# Patient Record
Sex: Male | Born: 1965 | Race: Black or African American | Hispanic: No | Marital: Married | Smoking: Former smoker
Health system: Southern US, Academic
[De-identification: ages and names within clinical notes are randomized; demographics above are authoritative.]

## PROBLEM LIST (undated history)

## (undated) DIAGNOSIS — M129 Arthropathy, unspecified: Secondary | ICD-10-CM

## (undated) DIAGNOSIS — I10 Essential (primary) hypertension: Secondary | ICD-10-CM

## (undated) DIAGNOSIS — Z973 Presence of spectacles and contact lenses: Secondary | ICD-10-CM

## (undated) DIAGNOSIS — E7800 Pure hypercholesterolemia, unspecified: Secondary | ICD-10-CM

## (undated) HISTORY — PX: HX FOOT SURGERY: 2100001154

## (undated) HISTORY — PX: HX TONSILLECTOMY: SHX27

## (undated) HISTORY — PX: FRACTURE SURGERY: SHX138

## (undated) HISTORY — PX: TONSILLECTOMY: SUR1361

---

## 2015-06-27 ENCOUNTER — Encounter (HOSPITAL_COMMUNITY)
Admission: EM | Disposition: A | Discharge status: Skilled Nursing Facility | Payer: Self-pay | Source: Home / Self Care | Attending: Surgery

## 2015-06-27 ENCOUNTER — Inpatient Hospital Stay
Admission: EM | Admit: 2015-06-27 | Discharge: 2015-07-01 | DRG: 482 | Disposition: A | Discharge status: Rehabilitation Facility | Payer: No Typology Code available for payment source | Attending: Surgery | Admitting: Surgery

## 2015-06-27 ENCOUNTER — Inpatient Hospital Stay (HOSPITAL_COMMUNITY): Payer: No Typology Code available for payment source

## 2015-06-27 ENCOUNTER — Emergency Department (EMERGENCY_DEPARTMENT_HOSPITAL): Payer: No Typology Code available for payment source

## 2015-06-27 ENCOUNTER — Other Ambulatory Visit (HOSPITAL_COMMUNITY): Payer: Self-pay

## 2015-06-27 ENCOUNTER — Encounter (HOSPITAL_COMMUNITY): Payer: Self-pay | Admitting: Student in an Organized Health Care Education/Training Program

## 2015-06-27 ENCOUNTER — Inpatient Hospital Stay (HOSPITAL_COMMUNITY): Payer: No Typology Code available for payment source | Admitting: Surgery

## 2015-06-27 ENCOUNTER — Inpatient Hospital Stay (HOSPITAL_COMMUNITY)
Admission: RE | Admit: 2015-06-27 | Discharge: 2015-06-27 | Disposition: A | Discharge status: Home / Self Care | Payer: No Typology Code available for payment source | Source: Ambulatory Visit

## 2015-06-27 ENCOUNTER — Inpatient Hospital Stay (HOSPITAL_COMMUNITY): Payer: No Typology Code available for payment source | Admitting: Anesthesiology

## 2015-06-27 ENCOUNTER — Emergency Department (HOSPITAL_COMMUNITY): Payer: No Typology Code available for payment source

## 2015-06-27 DIAGNOSIS — S72302B Unspecified fracture of shaft of left femur, initial encounter for open fracture type I or II: Secondary | ICD-10-CM

## 2015-06-27 DIAGNOSIS — Z23 Encounter for immunization: Secondary | ICD-10-CM

## 2015-06-27 DIAGNOSIS — W19XXXA Unspecified fall, initial encounter: Secondary | ICD-10-CM

## 2015-06-27 DIAGNOSIS — M25462 Effusion, left knee: Secondary | ICD-10-CM

## 2015-06-27 DIAGNOSIS — S72301A Unspecified fracture of shaft of right femur, initial encounter for closed fracture: Secondary | ICD-10-CM

## 2015-06-27 DIAGNOSIS — T1490XA Injury, unspecified, initial encounter: Secondary | ICD-10-CM

## 2015-06-27 DIAGNOSIS — S8991XA Unspecified injury of right lower leg, initial encounter: Secondary | ICD-10-CM

## 2015-06-27 DIAGNOSIS — S7292XA Unspecified fracture of left femur, initial encounter for closed fracture: Secondary | ICD-10-CM

## 2015-06-27 DIAGNOSIS — S3993XA Unspecified injury of pelvis, initial encounter: Secondary | ICD-10-CM

## 2015-06-27 DIAGNOSIS — X58XXXA Exposure to other specified factors, initial encounter: Secondary | ICD-10-CM | POA: Diagnosis present

## 2015-06-27 DIAGNOSIS — S7291XA Unspecified fracture of right femur, initial encounter for closed fracture: Secondary | ICD-10-CM

## 2015-06-27 DIAGNOSIS — S298XXA Other specified injuries of thorax, initial encounter: Secondary | ICD-10-CM

## 2015-06-27 DIAGNOSIS — W3189XA Contact with other specified machinery, initial encounter: Secondary | ICD-10-CM

## 2015-06-27 DIAGNOSIS — I499 Cardiac arrhythmia, unspecified: Secondary | ICD-10-CM

## 2015-06-27 DIAGNOSIS — S72302A Unspecified fracture of shaft of left femur, initial encounter for closed fracture: Secondary | ICD-10-CM

## 2015-06-27 DIAGNOSIS — S8992XA Unspecified injury of left lower leg, initial encounter: Secondary | ICD-10-CM

## 2015-06-27 DIAGNOSIS — F17210 Nicotine dependence, cigarettes, uncomplicated: Secondary | ICD-10-CM | POA: Diagnosis present

## 2015-06-27 DIAGNOSIS — M25461 Effusion, right knee: Secondary | ICD-10-CM

## 2015-06-27 DIAGNOSIS — S3991XA Unspecified injury of abdomen, initial encounter: Secondary | ICD-10-CM

## 2015-06-27 DIAGNOSIS — T1490 Injury, unspecified: Secondary | ICD-10-CM

## 2015-06-27 DIAGNOSIS — S7290XA Unspecified fracture of unspecified femur, initial encounter for closed fracture: Secondary | ICD-10-CM

## 2015-06-27 HISTORY — DX: Exposure to other specified factors, initial encounter: X58.XXXA

## 2015-06-27 HISTORY — DX: Unspecified fracture of left femur, initial encounter for closed fracture (CMS HCC): S72.92XA

## 2015-06-27 HISTORY — DX: Unspecified fracture of right femur, initial encounter for closed fracture (CMS HCC): S72.91XA

## 2015-06-27 HISTORY — DX: Presence of spectacles and contact lenses: Z97.3

## 2015-06-27 HISTORY — DX: Pure hypercholesterolemia, unspecified: E78.00

## 2015-06-27 LAB — CBC WITH DIFF
BASOPHIL #: 0.09 x10ˆ3/uL (ref 0.00–0.20)
BASOPHIL %: 1 %
EOSINOPHIL #: 0.1 x10ˆ3/uL (ref 0.00–0.50)
EOSINOPHIL %: 1 %
HCT: 42.2 % (ref 36.7–47.0)
HGB: 14 g/dL (ref 12.5–16.3)
LYMPHOCYTE #: 1.41 x10ˆ3/uL (ref 1.00–4.80)
LYMPHOCYTE %: 9 %
MCH: 31.5 pg (ref 27.4–33.0)
MCHC: 33.2 g/dL (ref 32.5–35.8)
MCV: 94.9 fL (ref 78.0–100.0)
MCV: 94.9 fL (ref 78.0–100.0)
MONOCYTE #: 1.06 x10ˆ3/uL — ABNORMAL HIGH (ref 0.30–1.00)
MONOCYTE %: 7 %
MPV: 9.1 fL (ref 7.5–11.5)
NEUTROPHIL #: 12.21 x10ˆ3/uL — ABNORMAL HIGH (ref 1.50–7.70)
NEUTROPHIL %: 82 %
PLATELETS: 257 x10ˆ3/uL (ref 140–450)
RBC: 4.45 x10ˆ6/uL (ref 4.06–5.63)
RDW: 13.4 % (ref 12.0–15.0)
WBC: 14.9 x10ˆ3/uL — ABNORMAL HIGH (ref 3.5–11.0)

## 2015-06-27 LAB — PT/INR
INR: 1.02 (ref 0.80–1.20)
PROTHROMBIN TIME: 11.4 s (ref 9.0–13.4)

## 2015-06-27 LAB — VENOUS BLOOD GAS/LACTATE/CO-OX/LYTES (NA/K/CA/CL/GLUC) - ORS ONLY
%FIO2 (VENOUS): 21 %
BASE EXCESS: 1.9 mmol/L (ref ?–3.0)
BICARBONATE (VENOUS): 24.6 mmol/L (ref 22.0–26.0)
CARBOXYHEMOGLOBIN: 2.3 % (ref 0.0–2.5)
CHLORIDE: 107 mmol/L (ref 96–111)
GLUCOSE: 112 mg/dL — ABNORMAL HIGH (ref 60–105)
HEMOGLOBIN: 14.4 g/dL (ref 12.0–18.0)
IONIZED CALCIUM: 1.29 mmol/L (ref 1.10–1.36)
LACTATE: 0.9 mmol/L (ref 0.0–1.3)
MET-HEMOGLOBIN: 1.9 % (ref 0.0–3.5)
O2CT: 7 % (ref 6.7–17.5)
OXYHEMOGLOBIN: 34.8 % — CL (ref 40.0–80.0)
PCO2 (VENOUS): 57 mmHg — ABNORMAL HIGH (ref 41.00–51.00)
PH (VENOUS): 7.32 (ref 7.31–7.41)
PO2 (VENOUS): 24 mmHg — ABNORMAL LOW (ref 35.0–50.0)
SODIUM: 138 mmol/L (ref 136–145)
WHOLE BLOOD POTASSIUM: 4.4 mmol/L (ref 3.5–5.0)

## 2015-06-27 LAB — BUN: BUN: 18 mg/dL (ref 8–25)

## 2015-06-27 LAB — PTT (PARTIAL THROMBOPLASTIN TIME): APTT: 24.6 s — ABNORMAL LOW (ref 25.1–36.5)

## 2015-06-27 LAB — CREATININE WITH EGFR
CREATININE: 1.36 mg/dL — ABNORMAL HIGH (ref 0.62–1.27)
ESTIMATED GFR: 59 mL/min/1.73mˆ2 — ABNORMAL LOW (ref >59)

## 2015-06-27 LAB — ETHANOL, SERUM/PLASMA: ETHANOL: NOT DETECTED

## 2015-06-27 SURGERY — INSERTION NAIL TROCHANTERIC FEMUR
Anesthesia: General | Laterality: Bilateral | Wound class: Clean Wound: Uninfected operative wounds in which no inflammation occurred

## 2015-06-27 MED ORDER — FAMOTIDINE 20 MG TABLET
20.0000 mg | ORAL_TABLET | Freq: Two times a day (BID) | ORAL | Status: DC
Start: 2015-06-28 — End: 2015-06-28
  Filled 2015-06-27 (×2): qty 1

## 2015-06-27 MED ORDER — GENTAMICIN 40 MG/ML INJECTION SOLUTION
5.0000 mg/kg | Freq: Once | INTRAMUSCULAR | Status: DC
Start: 2015-06-28 — End: 2015-06-27

## 2015-06-27 MED ORDER — IOPAMIDOL 300 MG IODINE/ML (61 %) INTRAVENOUS SOLUTION
100.00 mL | INTRAVENOUS | Status: AC
Start: 2015-06-27 — End: 2015-06-27

## 2015-06-27 MED ORDER — ONDANSETRON HCL (PF) 4 MG/2 ML INJECTION SOLUTION
4.0000 mg | Freq: Three times a day (TID) | INTRAMUSCULAR | Status: DC | PRN
Start: 2015-06-27 — End: 2015-06-30

## 2015-06-27 MED ORDER — PROPOFOL 10 MG/ML IV BOLUS
INJECTION | Freq: Once | INTRAVENOUS | Status: DC | PRN
Start: 2015-06-27 — End: 2015-06-28
  Administered 2015-06-27: 200 mg via INTRAVENOUS

## 2015-06-27 MED ORDER — LIDOCAINE (PF) 100 MG/5 ML (2 %) INTRAVENOUS SYRINGE
INJECTION | Freq: Once | INTRAVENOUS | Status: DC | PRN
Start: 2015-06-27 — End: 2015-06-28
  Administered 2015-06-27: 100 mg via INTRAVENOUS

## 2015-06-27 MED ORDER — DOCUSATE SODIUM 100 MG CAPSULE
100.0000 mg | ORAL_CAPSULE | Freq: Two times a day (BID) | ORAL | Status: DC
Start: 2015-06-28 — End: 2015-07-01
  Administered 2015-06-28 – 2015-07-01 (×7): 100 mg via ORAL
  Filled 2015-06-27 (×8): qty 1

## 2015-06-27 MED ORDER — MIDAZOLAM 1 MG/ML INJECTION SOLUTION
Freq: Once | INTRAMUSCULAR | Status: DC | PRN
Start: 2015-06-27 — End: 2015-06-28
  Administered 2015-06-27: 2 mg via INTRAVENOUS

## 2015-06-27 MED ORDER — ROCURONIUM 10 MG/ML INTRAVENOUS SOLUTION
Freq: Once | INTRAVENOUS | Status: DC | PRN
Start: 2015-06-27 — End: 2015-06-28
  Administered 2015-06-27: 100 mg via INTRAVENOUS

## 2015-06-27 MED ORDER — FENTANYL (PF) 50 MCG/ML INJECTION SOLUTION
200.0000 ug | INTRAMUSCULAR | Status: AC
Start: 2015-06-27 — End: 2015-06-27

## 2015-06-27 MED ORDER — FENTANYL (PF) 50 MCG/ML INJECTION SOLUTION
INTRAMUSCULAR | Status: AC
Start: 2015-06-27 — End: 2015-06-27
  Administered 2015-06-27: 100 ug via INTRAVENOUS
  Filled 2015-06-27: qty 4

## 2015-06-27 MED ORDER — REMIFENTANIL 50 MCG/ML INFUSION - FOR ANES
INTRAVENOUS | Status: DC | PRN
Start: 2015-06-27 — End: 2015-06-28
  Administered 2015-06-27: 0.1 ug/kg/min via INTRAVENOUS
  Administered 2015-06-28: 0 ug/kg/min via INTRAVENOUS

## 2015-06-27 MED ORDER — SODIUM CHLORIDE 0.9 % INTRAVENOUS SOLUTION
5.0000 mg/kg | Freq: Once | INTRAVENOUS | Status: AC
Start: 2015-06-28 — End: 2015-06-27
  Administered 2015-06-27: 500 mg via INTRAVENOUS
  Filled 2015-06-27: qty 12.5

## 2015-06-27 MED ORDER — LACTATED RINGERS INTRAVENOUS SOLUTION
INTRAVENOUS | Status: DC
Start: 2015-06-28 — End: 2015-06-29

## 2015-06-27 MED ORDER — CEFAZOLIN 1 GRAM SOLUTION FOR INJECTION
Freq: Once | INTRAMUSCULAR | Status: DC | PRN
Start: 2015-06-27 — End: 2015-06-28
  Administered 2015-06-27: 1000 mg via INTRAVENOUS

## 2015-06-27 MED ORDER — FENTANYL (PF) 50 MCG/ML INJECTION SOLUTION
INTRAMUSCULAR | Status: AC
Start: 2015-06-27 — End: 2015-06-27
  Administered 2015-06-27: 50 ug via INTRAVENOUS
  Filled 2015-06-27: qty 2

## 2015-06-27 MED ORDER — SODIUM CHLORIDE 0.9 % INTRAVENOUS SOLUTION
INTRAVENOUS | Status: DC
Start: 2015-06-27 — End: 2015-06-27

## 2015-06-27 MED ORDER — ENOXAPARIN 30 MG/0.3 ML SUBCUTANEOUS SYRINGE
30.0000 mg | INJECTION | Freq: Two times a day (BID) | SUBCUTANEOUS | Status: DC
Start: 2015-06-28 — End: 2015-06-29
  Administered 2015-06-28 – 2015-06-29 (×3): 30 mg via SUBCUTANEOUS
  Filled 2015-06-27: qty 0.3
  Filled 2015-06-27: qty 0
  Filled 2015-06-27 (×2): qty 0.3

## 2015-06-27 MED ORDER — MAGNESIUM HYDROXIDE 400 MG/5 ML ORAL SUSPENSION
15.0000 mL | ORAL | Status: DC
Start: 2015-06-28 — End: 2015-06-29
  Administered 2015-06-28: 1200 mg via ORAL

## 2015-06-27 MED ORDER — CEFAZOLIN 10 GRAM SOLUTION FOR INJECTION
2.0000 g | Freq: Once | INTRAMUSCULAR | Status: AC
Start: 2015-06-27 — End: 2015-06-27
  Administered 2015-06-27: 2 g via INTRAVENOUS

## 2015-06-27 MED ORDER — SODIUM CHLORIDE 0.9 % INTRAVENOUS SOLUTION
INTRAVENOUS | Status: DC | PRN
Start: 2015-06-27 — End: 2015-06-28

## 2015-06-27 MED ORDER — PHENYLEPHRINE 0.5 MG/5 ML (100 MCG/ML)IN 0.9 % SOD.CHLORIDE IV SYRINGE
INJECTION | Freq: Once | INTRAVENOUS | Status: DC | PRN
Start: 2015-06-27 — End: 2015-06-28
  Administered 2015-06-27 – 2015-06-28 (×9): 100 ug via INTRAVENOUS

## 2015-06-27 MED ORDER — SENNOSIDES 8.6 MG-DOCUSATE SODIUM 50 MG TABLET
1.0000 | ORAL_TABLET | Freq: Two times a day (BID) | ORAL | Status: DC
Start: 2015-06-28 — End: 2015-07-01
  Administered 2015-06-28 – 2015-07-01 (×7): 1 via ORAL
  Filled 2015-06-27 (×8): qty 1

## 2015-06-27 MED ORDER — GENTAMICIN IV - PHARMACIST TO DOSE PER PROTOCOL
Freq: Every day | Status: DC | PRN
Start: 2015-06-27 — End: 2015-06-27

## 2015-06-27 MED ORDER — HYDROMORPHONE 1 MG/ML IN 0.9 % SODIUM CHLORIDE INJECTION
100.0000 mL | INTRAMUSCULAR | Status: DC | PRN
Start: 2015-06-27 — End: 2015-06-29
  Administered 2015-06-28: 100 mL via INTRAVENOUS
  Filled 2015-06-27: qty 100

## 2015-06-27 MED ORDER — FENTANYL (PF) 50 MCG/ML INJECTION SOLUTION
Freq: Once | INTRAMUSCULAR | Status: DC | PRN
Start: 2015-06-27 — End: 2015-06-28
  Administered 2015-06-27: 100 ug via INTRAVENOUS

## 2015-06-27 MED ORDER — FENTANYL (PF) 50 MCG/ML INJECTION SOLUTION
50.00 ug | INTRAMUSCULAR | Status: AC
Start: 2015-06-27 — End: 2015-06-27

## 2015-06-27 MED ORDER — METHOCARBAMOL 500 MG TABLET
1000.00 mg | ORAL_TABLET | Freq: Four times a day (QID) | ORAL | Status: DC
Start: 2015-06-28 — End: 2015-07-01
  Administered 2015-06-28 – 2015-07-01 (×13): 1000 mg via ORAL
  Filled 2015-06-27 (×16): qty 2

## 2015-06-27 MED ORDER — LACTATED RINGERS INTRAVENOUS SOLUTION
INTRAVENOUS | Status: DC | PRN
Start: 2015-06-27 — End: 2015-06-28

## 2015-06-27 MED ORDER — ELECTROLYTE-A INTRAVENOUS SOLUTION
INTRAVENOUS | Status: DC
Start: 2015-06-28 — End: 2015-06-28

## 2015-06-27 MED ADMIN — lactated Ringers intravenous solution: INTRAVENOUS | @ 22:00:00

## 2015-06-27 SURGICAL SUPPLY — 49 items
APPL 70% ISPRP 2% CHG 26ML 13._2X13.2IN CHLRPRP PREP DEHP-FR (WOUND CARE/ENTEROSTOMAL SUPPLY)
APPL 70% ISPRP 2% CHG 26ML CHLRPRP HI-LT ORNG PREP STRL LF  DISP CLR (WOUND CARE SUPPLY) IMPLANT
BANDAGE COFLX NL 5YDX6IN_EASYTEAR ADH CHSV PAT TCH STRN (WOUND CARE/ENTEROSTOMAL SUPPLY) ×1
BLANKET 3M BAIR HUG ADLT UPR B ODY 74X24IN PLMR 2 INCS ADH (MISCELLANEOUS PT CARE ITEMS) ×2 IMPLANT
CONV USE ITEM 313924 - BANDAGE COFLX NL 5YDX6IN_EASYTEAR ADH CHSV PAT TCH STRN (WOUND CARE SUPPLY) ×1 IMPLANT
CONV USE ITEM 338659 - PACK SURG CUSTOM GN ORTHO NONST DISP LF (CUSTOM TRAYS & PACK) ×2 IMPLANT
DONUT EXTREMITY CUSHIONING 31143137 (POSITIONING PRODUCTS) ×2 IMPLANT
DRAPE 2 INCS FILM PCH ISOL 129X100IN LRG STRDRP IOBN STRL SURG PLASTIC 27X12IN CLR (PROTECTIVE PRODUCTS/GARMENTS) ×1 IMPLANT
DRAPE 2 INCS FILM PCH ISOL 129_X100IN LRG STRDRP IOBN STRL (PROTECTIVE PRODUCTS/GARMENTS) ×1
DRAPE BACKTABLE 72IN 10/CS 37501 (Other Miscellaneous) ×2 IMPLANT
DRAPE CARM FLRSCP EXPD CLPSBL C-ARMOR STRL EQP (DRAPE/PACKS/SHEETS/OR TOWEL) ×2 IMPLANT
DRAPE CARM FLRSCP EXPD CLPSBL_C-ARMOR STRL EQP (DRAPE/PACKS/SHEETS/OR TOWEL) ×2
DRESSING ABDOM 8 X 10IN 7198D 216/CS (WOUND CARE SUPPLY) ×1 IMPLANT
DRESSING ABDOM 8 X 10IN 7198D 216/CS (WOUND CARE/ENTEROSTOMAL SUPPLY) ×1
DRESSING OIL EMUL NONADH 3X8IN 6113 STRL CURITY LF 24/BX (WOUND CARE SUPPLY) IMPLANT
DRESSING OIL EMUL NONADH 3X8IN 6113 STRL CURITY LF 24/BX (WOUND CARE/ENTEROSTOMAL SUPPLY)
DRESSING XEROFORM 1 X 8IN 8884431302 (WOUND CARE SUPPLY) ×1 IMPLANT
DRESSING XEROFORM 1 X 8IN 8884431302 (WOUND CARE/ENTEROSTOMAL SUPPLY) ×1
GOWN SURG XL AAMI L3 NONREINFO_RCE HKLP CLSR STRL LTX PNK SMS (DGOW)
GOWN SURG XL L3 NONREINFORCE HKLP CLSR STRL LTX PNK SMS 47IN (DGOW) IMPLANT
HANDLE RIGID PLASTIC STRL LF  DISP DVN EZ HNDL SURG LIGHT (INSTRUMENTS) ×1
HANDLE RIGID PLASTIC STRL LF_DISP DVN EZ HNDL SURG LIGHT (INSTRUMENTS) ×1
KIT PLS LAV PLSVC + FAN SPRAY STRL LF  DISP (IRR) ×1 IMPLANT
KIT PLS LAV PLSVC + FAN SPRAY_STRL LF DISP (IRR) ×2
KIT RM TURNOVER CLEANOP CSTM INFCT CONTROL (KITS & TRAYS (DISPOSABLE)) ×1
KIT RM TURNOVER CLEANOP CSTM I_NFCT CONTROL (KITS & TRAYS (DISPOSABLE)) ×1
KIT RM TURNOVER CLEANOP CUSTOM INFCT CONTROL (KITS & TRAYS (DISPOSABLE)) ×1 IMPLANT
LABEL E-Z STICK_STLEZP1 100EA/CS (LABELS/CHART SUPPLIES) ×1
LABEL MED EZ PEEL MRKR LF (LABELS/CHART SUPPLIES) ×1 IMPLANT
MBO USE ITEM 317672 - HANDLE RIGID PLASTIC STRL LF  DISP DVN EZ HNDL SURG LIGHT (SURGICAL INSTRUMENTS) ×1 IMPLANT
NAIL 10MM TI CANN ANTGRD RETRO FEM 400MM STRL EXPERT INTRAMED RND BRL GRN (Nail) ×2 IMPLANT
PACK GENERAL ORTHO CUSTOM (CUSTOM TRAYS & PACK) ×2
PACK SURG CSTM GN ORTHO NONST DISP LF (CUSTOM TRAYS & PACK) ×2
PAD ARMBOARD FOAM BLU_FP-ECARM (POSITIONING PRODUCTS) ×2
PAD ARMBRD BLU (POSITIONING PRODUCTS) ×2 IMPLANT
ROD REAMING 950MM 2.5MM BALL TIP STRL (ORTHOPEDICS (NOT IMPLANTS)) ×2 IMPLANT
ROD REAMING 950MM 2.5MM BALL T_IP STRL (ORTHOPEDICS (NOT IMPLANTS)) ×2
SCREW BONE 5MM 4.3MM 40MM SLF TAP LOCK STRDR BLUNT TIP TI NIOBIUM AL T25 F/T NONST LIGHT GRN (Screw) ×2 IMPLANT
SCREW BONE 5MM 70MM SLF TAP LOCK BLUNT TIP 2 LEAD TI TIB T25 F/T NONST LIGHT GRN EXPERT (Screw) ×2 IMPLANT
SCREW BONE 5MM 85MM SLF TAP LOCK BLUNT TIP 2 LEAD TI TIB T25 F/T NONST LIGHT GRN EXPERT (Screw) ×2 IMPLANT
SPONGE GAUZE STRL 4 X 4IN TUB_6939 1280/CS (WOUND CARE SUPPLY) ×2 IMPLANT
SPONGE GAUZE STRL 4 X 4IN TUB_6939 1280/CS (WOUND CARE/ENTEROSTOMAL SUPPLY) ×2
STAPLER SKIN 4.1X6.5MM 35 W STPL CART LF  APS U DISP CLR SS PLASTIC (ENDOSCOPIC SUPPLIES) ×1 IMPLANT
STAPLER SKIN WIDE 35W_8886803712 12/BX (INSTRUMENTS ENDOMECHANICAL) ×1
SUTURE 1 CTX VICRYL 27IN VIOL BRD COAT ABS (SUTURE/WOUND CLOSURE) ×2 IMPLANT
SUTURE 1 CTX VICRYL 27IN VIOL_BRD COAT ABS (SUTURE/WOUND CLOSURE) ×2
SUTURE 2-0 CT1 VICRYL 27IN UNDYED BRD COAT ABS (SUTURE/WOUND CLOSURE) ×2 IMPLANT
SUTURE 2-0 CT1 VICRYL 27IN UND_YED BRD COAT ABS (SUTURE/WOUND CLOSURE) ×2
TAPE MICROFOAM 4IN 15284 BX/3 (MED/SURG TAPES) ×2 IMPLANT

## 2015-06-27 NOTE — H&P (Addendum)
[facility]  Trauma History & Physical      [name],[name], 49 y.o. male  Date of Birth:  08-Nov-1966  Date of Admission:  06/27/2015    Attending Physician: Dorothy Spark, MD    HPI:   Trauma Level Alert: P2  Time of Trauma Page: 2103   Arrival Time: 2125   Arriving via: Helicopter  Arriving from: Scene    Pre-Hospital Report:  Time of Injury: 1930   Patient Condition: Stable  GCS: E4 M6 V5= [15]    Intubated: No  Fluids Received in Route: 0 mL crystalloid  C-Collar: No  Back Board: No  Loss of Consciousness: No    Mechanism of Injury:   Crush injury by heavy machinery    Arrival to [place]:  Information Obtained from: Patient, Healthcare Provider, and Review of the medical records  Chief Complaint: Bilateral femur fractures  Additional HPI Details:  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 He was down on one knee when it happened and he was knocked back. He leaned forward to avoid falling but overcompensated and fell forward resulting in getting lodged and crushed in the machine at thigh level. When extricated, witnesses noted his thighs were grossly deformed. He arrived to [facility] from scene by helicopter. He was GCS 15 and vitally stable. He denies LOC or hitting his head, neck, or back.     ROS:  MUST comment on all "Abnormal" findings   ROS Other than ROS in the HPI, all other systems were negative.    PAST MEDICAL/ FAMILY/ SOCIAL HISTORY:     History reviewed. No pertinent past medical history.    Tetanus: Unknown  Medications Prior to Admission     None         No Known Allergies  Past Surgical History   Procedure Laterality Date    Hx tonsillectomy       History   Substance Use Topics    Smoking status: Current Every Day Smoker    Smokeless tobacco: Not on file    Alcohol Use: Yes     Family History   Problem Relation  Age of Onset    Anesth Problems Neg Hx        PHYSICAL EXAMINATION: MUST comment on all "Abnormal" findings    Initial Vitals:   T 36.8  BP  158/86  HR 84  RR 16  SaO2 99% RA  Exam   GEN:   NAD, appears stated age  HEENT:   Normocephalic; atraumatic pupils equal, round and reactive to light; extraocular movements are intact.  Conjunctivae pink, nasal mucosa normal, mucous membranes moist.  No malocclusion.    NECK:   C-collar in place  PULM:   Lung sounds clear to auscultation bilaterally.  CV:   Regular rate and rhythm; S1/S2; no murmur, rub, or gallop.  Chest:  External examination non-tender to palpation. and No abrasions or contusions  ABD:   Abdomen soft, nontender, and nondistended.  Pelvis: Non-tender to compression /palpation and No evidence of injury  GU/RECTAL: Normal external inspection.  DRE deferred. No gross blood.  Back:  No evidence of injury, Non-tender to midline palpation and no step-off  MS: Grossly deformed thighs bilaterally. Extremely tender to movement or palpation   Distal pulses intact.  Motor and sensation intact  BLE. Normal strength and ROM BUE.   NEURO:   Alert and oriented to person, place and time.  Cranial nerves grossly intact.  GCS 15  Vascular:  All pulses palpable and equal bilaterally  Integumentary:  Pink, warm, and dry      DIAGNOSTIC STUDIES: Comment on "Positives"   Labs:  I have reviewed all lab results.  Lab Results for Last 24 Hours:    Results for orders placed or performed during the hospital encounter of 06/27/15 (from the past 24 hour(s))   PT/INR   Result Value Ref Range    PROTHROMBIN TIME 11.4 9.0-13.4 seconds    INR 1.02 0.80-1.20   PTT (PARTIAL THROMBOPLASTIN TIME)   Result Value Ref Range    APTT 24.6 (L) 25.1-36.5 seconds   CBC WITH DIFF   Result Value Ref Range    WBC 14.9 (H) 3.5-11.0 x103/uL    RBC 4.45 4.06-5.63 x106/uL    HGB 14.0 12.5-16.3 g/dL    HCT 42.2 36.7-47.0 %    MCV 94.9 78.0-100.0 fL    MCH 31.5 27.4-33.0 pg    MCHC 33.2 32.5-35.8 g/dL    RDW  13.4 12.0-15.0 %    PLATELETS 257 140-450 x103/uL    MPV 9.1 7.5-11.5 fL    NEUTROPHIL % 82 %    LYMPHOCYTE % 9 %    MONOCYTE % 7 %    EOSINOPHIL % 1 %    BASOPHIL % 1 %    NEUTROPHIL # 12.21 (H) 1.50-7.70 x103/uL    LYMPHOCYTE # 1.41 1.00-4.80 x103/uL    MONOCYTE # 1.06 (H) 0.30-1.00 x103/uL    EOSINOPHIL # 0.10 0.00-0.50 x103/uL    BASOPHIL # 0.09 0.00-0.20 x103/uL   VENOUS BLOOD GAS/LACTATE/CO-OX/LYTES (NA/K/CA/CL/GLUC)   Result Value Ref Range    %FIO2 21.0 %    PH 7.32 7.31-7.41    PCO2 57.00 (H) 41.00-51.00 mm/Hg    PO2 24.0 (L) 35.0-50.0 mm/Hg    BASE EXCESS 1.9 -3.0-3.0 mmol/L    BICARBONATE 24.6 22.0-26.0 mmol/L    SODIUM 138 136-145 mmol/L    WHOLE BLOOD POTASSIUM 4.4 3.5-5.0 mmol/L    CHLORIDE 107 96-111 mmol/L    IONIZED CALCIUM 1.29 1.10-1.36 mmol/L    GLUCOSE 112 (H) 60-105 mg/dL    LACTATE 0.9 0.0-1.3 mmol/L    HEMOGLOBIN 14.4 12.0-18.0 g/dL    OXYHEMOGLOBIN 34.8 (LL) 40.0-80.0 %    CARBOXYHEMOGLOBIN 2.3 0.0-2.5 %    MET-HEMOGLOBIN 1.9 0.0-3.5 %    O2CT 7.0 6.7-17.5 %       Radiology:   Internal Studies-  CXR: Negative  XR Pelvis: Negative  FAST: Negative  CT Abdomen and Pelvis w:    Incidental findings: As above    Consults Ordered   Orthopedics    Assessment & Plan/Recommendations:      Active Hospital Problems   (*Primary Problem)    Diagnosis    *Crush accident     Crushed at thigh level by sand mixer       Fracture of left femur     Mid-shaft      Fracture of right femur     Mid-shaft         Plan:  Admit to Trauma Service as Stepdown status under care of Dr. Cherie Dark.  Plan for OR with Ortho for bilateral femur fractures  NPO, IVF advance diet as tolerated after OR  PCA for pain control    Georgia Duff, MD, 06/27/2015 21:50  Late entry for 06/27/2015. I saw and examined the patient.  I reviewed the resident's note.  I agree with the findings and plan of care as documented in the resident's note.  Any exceptions/additions are edited/noted.    Dorothy Spark, MD 07/01/2015, 14:11

## 2015-06-27 NOTE — Consults (Addendum)
[place] [facility]  Initial Consult Note  Attending: Rayven Hendrickson  06/27/2015      Consult requested by: Trauma Blue  Information obtained from: patient    CC: bilateral thigh pain    HPI: [name] is a 49 y.o. Unknown male who presents with bilateral thigh injuries after sustaining a high energy injury where he was working on a [employer] belt when it turned on and he somehow got lodged and crushed at the thigh level in the machine. Denies LOC or trauma to head or any other part of the body. Denies any other complaints.     Allergies: NKDA    PMH: HTN      PSH:   Cyst removal R foot  Tonsillectomy      FMH:   Denies        Social:   Smokes and chews tobacco  Rare social etoh use    Meds:   Current medication - unknown HTN med, patient can't remember    ROS: As per HPI, otherwise all other ROS negative  Negative for fever or chills, SOB, chest pain or changes in bowel or bladder habits    PE:  Vitals: There were no vitals taken for this visit.  General: NAD   HEENT: NCAT   Resp: non labored   CVS: Regular rate   Abd: non distended   Skin: warm and dry   Psych: mood/affect appropriate for clinical situation     Ext: BLE's - SILT in LFC, SP, DP, nerve distributions bilaterally. 2+ DP/PT pulses bilaterally. Able to DF/PF ankles on command bilaterally. No open wounds. Bilateral thigh swelling, no ecchymosis. Able to wiggle all toes on command.    Fracture: Yes         Site Laterality Open/Closed Displaced/Non Displaced Fracture Type If Pathological-Cause of Fracture   Distal femur  Bilateral Closed    Displaced           Traumatic   Not Pathological   Additional Fracture(s):              No    Labs: I have reviewed all current lab results     Imaging: Plain film radiographs of bilat femur knees and tib fib reviewed. There are bilateral distal 1/3 femoral diaphyseal fractures.    Assessment:  49 yo male with bilateral femur fx's    Plan:  - to OR tonight with Rasheema Truluck for bilat IM nails  -  Consented  - Card in  - Type and cross ordered, EKG done, CXR done  - f/u labs  - Preop ancef hanging  - NPO now  - NWB BLE    Chryl Heck, MD  06/27/2015, 22:12  PGY-2 ORTHOPAEDICS  PAGER # [phone]      I saw and examined the patient.  I reviewed the resident's note.  I agree with the findings and plan of care as documented in the resident's note.  Any exceptions/additions are edited/noted.    Ernestine Conrad, MD 06/27/2015, 23:23

## 2015-06-27 NOTE — ED Provider Notes (Signed)
Attending Physician: Dr. Shari Heritage  Resident Physician: Dr. Otelia Santee    CC: BL femur fx  HPI  06/27/2015, 21:39  48 y.o.male P2 Trauma page for BL femur fx.    Pt transported by EMS from scene with no LOC.   Patient was backboarded and collared.   Vitals were stable. GCS of 15 on arrival.    Pt states he was in a sand mixer at work PTA when it was turned on and he fell forward and tangled his BL legs in the mixer. Denies neck pain or head pain. EMS states pt's legs were bent and crossed when he was extracted from the mixer with obvious BL femur fx. Pt was given Fentanyl and 2.5mg  Versed en route to ED. Pt's last meal was at 4:30pm. Pt takes 800mg  Ibuprofen daily for muscle pains from work. NKDA. Denies any other sx or complaints.      Tetanus status- unknown   Anticoagulant use - none      Review of Systems  Constitutional: No diaphoresis   Skin: No abrasions or wounds  HENT: No headache or neck pain  Eyes: No vision changes   Cardio: No chest pain or pressure   Respiratory: No dyspnea or pleuritic pain  GI:  No nausea, vomiting or abdominal pain  GU:  No urgency or hematuria  MSK: No extremity or back pain. +BL leg pain/ obvious deformity  Neuro: No seizures or LOC  Psychiatric: No depression, SI or substance abuse  All other systems reviewed and are negative.      History:   PMH:    Past Medical History   Diagnosis Date    Wears glasses     Hypercholesteremia        PSH:    Past Surgical History   Procedure Laterality Date    Hx tonsillectomy           Social Hx:    History     Social History    Marital Status: Unknown     Spouse Name: N/A    Number of Children: N/A    Years of Education: N/A     Occupational History    Not on file.     Social History Main Topics    Smoking status: Current Every Day Smoker    Smokeless tobacco: Not on file    Alcohol Use: Yes    Drug Use: Not on file    Sexual Activity: Not on file     Other Topics Concern    Not on file     Social History Narrative    No narrative  on file     Family Hx:   Family History   Problem Relation Age of Onset    Anesth Problems Neg Hx      Allergies: No Known Allergies    Above history reviewed with patient, changes are as documented.    Physical Exam   Vitals: see nurse notes    Constitutional: GCS 15  HENT:   Head: No external signs of trauma  Mouth/Throat: Midface stable. No malocclusion.  Eyes: EOMI. Pupils are 2 mm, round and reactive bilaterally.  Ears: TMs are intact bilaterally. No hematomas.  Nose: No nasal septal hematoma. No gross deformity.  Neck: C-collar in place. No midline C-spine tenderness. No step-offs. Full ROM of the neck.    Cardiovascular: RRR. Pulses present in all 4 extremity.   Pulmonary/Chest: BS equal bilaterally. No tenderness or ecchymosis.  Abdomen: No tenderness or  ecchymosis.     Musculoskeletal:   Pelvis: No instability  Back: No midline tenderness. No step-offs.   Extremities: Obvious deformity of proximal BL LE, no obvious open injuries. Distal pulse intact bilaterally  GU: rectal deferred   Skin: No laceration. No abrasion.   Neuro: No focal neurological deficits. GCS as above. SILT throughout. Strength intact in BL LE but limited secondary to pain. BL UE strength 5/5.   Psych: normal mood and affect  Nursing notes/chart reviewed.    Course  Pt was paged as a priority 2 trauma.  Trauma team was present upon arrival of patient to the ED.  Primary, secondary, and tertiary surveys were performed and appropriate imaging was ordered. During primary, secondary, tertiary surveys the following therapies were instituted:  Orders Placed This Encounter    XR CHEST AP MOBILE    XR PELVIS    ED [place] FAST EXAM                  XR FEMURS BILATERAL    XR FEMURS BILATERAL    XR KNEE BILATERAL    XR TIBIAS-FIBULAE BILATERAL    CANCELED: XR ANKLES BILATERAL    CT TRAUMA ABDOMEN PELVIS W  IV CONTRAST    CBC/DIFF    PT/INR    PTT (PARTIAL THROMBOPLASTIN TIME)    BUN    CREATININE    ETHANOL, SERUM    URINALYSIS    DRUG  SCREEN, HIGH OPIATE CUTOFF, WITHOUT CONFIRMATION, URINE    VENOUS BLOOD GAS/COOX/LYTES/LAC    RAINBOW DRAW - RUBY ONLY    CBC WITH DIFF    GOLD TOP TUBE    RED TOP TUBE    LIGHT GREEN TOP TUBE    DARK GREEN TUBE    CANCELED: OXYGEN - NON REBREATHER MASK    ECG 12-LEAD    TYPE AND SCREEN    INSERT & MAINTAIN PERIPHERAL IV ACCESS    PATIENT CLASS/LEVEL OF CARE DESIGNATION    NS premix infusion    fentaNYL (PF) (SUBLIMAZE) injection ---Cabinet Override    iopamidol (ISOVUE-300) 61% infusion    fentaNYL (SUBLIMAZE) 50 mcg/mL injection    fentaNYL (SUBLIMAZE) 50 mcg/mL injection    fentaNYL (PF) (SUBLIMAZE) injection ---Cabinet Override       Meds that were given are noted on the nursing chart    Labs:  Labs Reviewed   PTT (PARTIAL THROMBOPLASTIN TIME) - Abnormal; Notable for the following:     APTT 24.6 (*)     All other components within normal limits    Narrative:     Therapeutic range for unfractionated heparin is 60.0-100.0 seconds.   CREATININE - Abnormal; Notable for the following:     CREATININE 1.36 (*)     ESTIMATED GFR 59 (*)     All other components within normal limits   VENOUS BLOOD GAS/LACTATE/CO-OX/LYTES (NA/K/CA/CL/GLUC) - Abnormal; Notable for the following:     PCO2 57.00 (*)     PO2 24.0 (*)     GLUCOSE 112 (*)     OXYHEMOGLOBIN 34.8 (*)     All other components within normal limits   CBC WITH DIFF - Abnormal; Notable for the following:     WBC 14.9 (*)     NEUTROPHIL # 12.21 (*)     MONOCYTE # 1.06 (*)     All other components within normal limits   PT/INR - Normal    Narrative:     Coumadin therapy INR range for Conventional Anticoagulation  is 2.0 to 3.0 and for Intensive Anticoagulation 2.5 to 3.5.   BUN - Normal   CBC/DIFF    Narrative:     The following orders were created for panel order CBC/DIFF.  Procedure                               Abnormality         Status                     ---------                               -----------         ------                     CBC WITH  NGEX[[id number]]                Abnormal            Final result                 Please view results for these tests on the individual orders.   ETHANOL, SERUM   URINALYSIS, MACROSCOPIC   DRUG SCREEN, HIGH OPIATE CUTOFF, NO CONFIRMATION, URINE   RAINBOW DRAW - RUBY ONLY    Narrative:     The following orders were created for panel order RAINBOW DRAW - RUBY ONLY.  Procedure                               Abnormality         Status                     ---------                               -----------         ------                     GOLD TOP WNUU[[id number]]                                                               RED TOP HKVQ[[id number]]                                     In process                 LIGHT GREEN TOP IEPP[[id number]]                                                        DARK GREEN SAYT[[id number]]  In process                   Please view results for these tests on the individual orders.   GOLD TOP TUBE   RED TOP TUBE   LIGHT GREEN TOP TUBE   DARK GREEN TUBE   RAINBOW DRAW - RUBY ONLY    Narrative:     The following orders were created for panel order RAINBOW DRAW - RUBY ONLY.  Procedure                               Abnormality         Status                     ---------                               -----------         ------                     BLOOD BANK HOLD [id number]                             In process                   Please view results for these tests on the individual orders.   TYPE AND SCREEN   CROSSMATCH ADDITIONAL  RED CELLS   BLOOD BANK HOLD TUBE       Radiology:  Results for orders placed or performed during the hospital encounter of 06/27/15 (from the past 72 hour(s))   ED [place] FAST EXAM                   Status: None    Narrative    *Exam not read by Radiology.  *Refer to ED note for result.   XR FEMURS BILATERAL     Status: None (Preliminary result)    Narrative    XR FEMURS BILATERAL performed on Jun 27, 2015  9:48 PM.    INDICATION: 49  y.o. male with trauma.    TECHNIQUE: 3 views of the bilateral femurs were obtained    COMPARISON: None available.    FINDINGS: There is redemonstration of displaced fractures at the   mid-distal femoral diaphyses bilaterally with overlying soft tissue   swelling.  No additional fractures are seen within the right hip.      Impression     Displaced fractures of the mid-distal femoral diaphyses   bilaterally.   XR AP MOBILE CHEST     Status: None (Preliminary result)    Narrative    XR AP MOBILE CHEST performed on Jun 27, 2015  9:55 PM.    INDICATION: 49 y.o. male with Chest Trauma.    TECHNIQUE: Frontal view of the chest.    COMPARISON: None available.    FINDINGS: Evaluation is suboptimal secondary to superimposition of support   devices.  A linear radiopaque density projecting over the right hemithorax   is likely extrinsic to the patient.  The cardiac size and pulmonary   vasculature are within normal limits.  The lungs are clear bilaterally   with no evidence of focal consolidation, pleural effusion, or pulmonary   edema.  The upper abdomen demonstrate no acute abnormality.  The   visualized osseous structures are unremarkable.      Impression     No acute cardiopulmonary process.   XR PELVIS     Status: None (Preliminary result)    Narrative    XR PELVIS performed on Jun 27, 2015  9:55 PM.    INDICATION: 49 y.o. male with Pelvic Injury.    TECHNIQUE: Single view of the pelvis was obtained    COMPARISON: None available.    FINDINGS: There is no pubic diastases or sacroiliac joint disruption.  The   sacral ala are symmetric.  Both femoroacetabular joints are preserved in   normal anatomic alignment.      Impression     No evidence of acute pelvic fracture.    CT TRAUMA ABDOMEN PELVIS W  IV CONTRAST     Status: None (Preliminary result)    Narrative    CLINICAL HISTORY: 49 y.o. male with Abdomen & Pelvic Trauma.    CT TRAUMA ABDOMEN PELVIS W  IV CONTRAST performed for Orvil Feil on   Jun 27, 2015  9:57 PM.  Axial images from the lung bases to the pubic   symphysis were obtained.  Multiplanar images with bone, lung, and soft   tissue windows were reviewed.    INTRAVENOUS CONTRAST: 143ml's of Isovue 300.    COMPARISON: None available.    FINDINGS: Bilateral dependent atelectasis is present within the lungs.    There is no identified focal consolidation or pleural effusion.  Partial   imaging of the heart demonstrates a normal cardiac size with no identified   pericardial effusion.  The liver and spleen are unremarkable.  The   gallbladder is moderately distended with no identified calcified   gallstones.  The pancreas demonstrates moderate atrophic changes with no   duct dilation.  The adrenal glands are within normal limits bilaterally.    The kidneys are grossly unremarkable without evidence of renal calculi or   hydronephrosis.  Included portions of the ureters demonstrate no acute   abnormality.  The bladder is minimally distended and unopacified, limiting   evaluation.  The prostate gland is mildly enlarged.    The gastric lumen is relatively distended with no identified wall   irregularity.  Evaluation of the bowel is limited due to incomplete   contrast opacification, but no abnormally dilated loops are seen to   suggest obstruction.  Numerous scattered uncomplicated colonic diverticula   are seen.  Moderate fecal retention is noted throughout the colon.  There   is no identified intraperitoneal free air or free fluid.    The descending abdominal aorta is of normal caliber with minimal scattered   calcifications.  Nonspecific mesenteric lymph nodes are identified.  The   soft tissues are unremarkable.  No fractures are seen.      Impression     No evidence for intra-abdominal traumatic injury.         - EKG - NSR, rate 72. RSR' pattern V1 and V2. No acute ST or T wave changes concerning for ischemia  - FAST - Negative   - Radiographical studies reviewed.     Therapy/Procedures/Course/MDM:    Patient was vitally  stable throughout visit.     Imaging showed: Displaced fractures of the mid-distal femoral diaphyses bilaterally. No evidence of acute pelvic fracture. No acute cardiopulmonary process. No evidence for intra-abdominal traumatic injury.    Lab results remarkable for: Cr 1.36, WBC 14.9   Patient received: Fentanyl    Consults:  Trauma     Disposition:     Patient will be admitted to Trauma service for further evaluation and management.        Clinical Impression:   Encounter Diagnoses   Name Primary?    Trauma Yes    Femur fracture, left     Femur fracture, right        I am scribing for, and in the presence of, Dr. Otelia Santee for services provided on 06/27/2015.  Reggie Pile, SCRIBE     I personally performed the services described in this documentation, as scribed in my presence, and it is both accurate and complete.    Karene Fry Otelia Santee,  MD, 06/30/2015, 08:31  PGY2  [facility]

## 2015-06-27 NOTE — Care Plan (Signed)

## 2015-06-27 NOTE — Procedures (Addendum)
The following procedure note was written per dictation of Dr. Otelia Santee, by Reggie Pile, scribe.    Encounter Date: 06/27/2015    Emergency Department Procedure:    FAST Ultrasound:    Time: 2140  Indication:: Blunt Thoracoabdominal trauma   Interpretation: Heart: Pericardial Effusion is NOT VISUALIZED, Cardiac Activity is PRESENT, Cardiac Activty is NOT Severely Depressed  Interpretation: Abdomen: NEGATIVE for Free fluid RUQ, NEGATIVE for  Free fluid pelvis , NEGATIVE for Free fluid LUQ   Interpretation: Chest: NEGATIVE for Right pneumothorax , NEGATIVE for Right pleural fluid , NEGATIVE for  Left pleural fluid , NEGATIVE for Left pneumothorax   Summary: : Negative within limits & scope of exam  Attending Physician: Lillia Abed      I personally performed the services described in this documentation, as scribed in my presence, and it is both accurate and complete.    Karene Fry Otelia Santee,  MD, 06/30/2015, 08:20  PGY2  [facility]        I was physically present for the key portions of obtaining the [place] images, have personally reviewed and interpreted the images and agree with the findings as documented.    Mickel Baas, MD

## 2015-06-27 NOTE — Anesthesia Preprocedure Evaluation (Signed)
Physical Exam:     Airway       Mallampati: I    TM distance: >3 FB    Neck ROM: full  Mouth Opening: good.  Facial hair  Beard  No endotracheal tube present  No Tracheostomy present    Dental                      Pulmonary    Breath sounds clear to auscultation  (-) no rhonchi, no decreased breath sounds, no wheezes, no rales and no stridor     Cardiovascular    Rhythm: regular  Rate: Normal  (-) no friction rub and no murmur     Other findings            Anesthesia Plan:  Planned anesthesia type: general  ASA 2 - emergent       NPO Status: Full stomach precautions.    Anesthetic plan and risks discussed with patient.    Anesthesia issues/risks discussed are: Dental Injuries, Stroke, Nerve Injuries, Intraoperative Awareness/ Recall, Eye /Visual Loss, Post-op Pain Management, Aspiration, PONV, Blood Loss, Post-op Intubation/Ventilation, Post-op Agitation/Tantrum, Difficult Airway and Cardiac Events/MI.    Use of blood products discussed with patient whom.     Plan discussed with attending.                    Emergent case  Injury 1900 on 06/27/15  NPO 1645 for food, 1900 for water  Denies any anesthesia problems, family hx of anesthesia problems  Good exercise tolerance    Plan for RSI, GETA

## 2015-06-27 NOTE — ED Attending Note (Addendum)
Department of Emergency Medicine      I was physically present and directly supervised this patient's care.  Patient was seen and examined.  The resident's history and exam were reviewed.  Key elements in addition to and/or correction of that documentation are as follows:    Vitals stable  This is a 49 y.o. male P2 trauma fall into sand mixer with bilateral femur fractures and deformities.       Fentanyl and versed PTA by [employer].      Old records were reviewed.    Radiology Images were reviewed by myself.    Disposition: Admitted    Clinical Impression:     Encounter Diagnosis   Name Primary?    Trauma Yes       Critical Care:  37 minutes

## 2015-06-28 ENCOUNTER — Encounter (HOSPITAL_COMMUNITY): Payer: Self-pay

## 2015-06-28 DIAGNOSIS — S72301A Unspecified fracture of shaft of right femur, initial encounter for closed fracture: Secondary | ICD-10-CM

## 2015-06-28 DIAGNOSIS — S72302B Unspecified fracture of shaft of left femur, initial encounter for open fracture type I or II: Secondary | ICD-10-CM

## 2015-06-28 LAB — DRUG SCREEN, HIGH OPIATE CUTOFF, NO CONFIRMATION, URINE
AMPHETAMINES URINE: NEGATIVE
BARBITURATES URINE: POSITIVE — AB
BENZODIAZEPINES URINE: POSITIVE — AB
BUPRENORPHINE URINE: NEGATIVE
CANNABINOIDS URINE: NEGATIVE
COCAINE METABOLITES URINE: NEGATIVE
CREATININE RANDOM URINE: 114 mg/dL
ECSTASY/MDMA URINE: NEGATIVE
METHADONE URINE: NEGATIVE
OPIATES URINE (HIGH CUTOFF): POSITIVE — AB
OXYCODONE URINE: NEGATIVE
OXYCODONE URINE: NEGATIVE

## 2015-06-28 LAB — ECG 12-LEAD
Atrial Rate: 72 {beats}/min
Calculated P Axis: 71 degrees
Calculated R Axis: 34 degrees
PR Interval: 158 ms
QT Interval: 376 ms
QTC Calculation: 411 ms
Ventricular rate: 72 {beats}/min

## 2015-06-28 LAB — BASIC METABOLIC PANEL
ANION GAP: 8 mmol/L (ref 4–13)
BUN/CREA RATIO: 14 (ref 6–22)
BUN: 17 mg/dL (ref 8–25)
CALCIUM: 9.2 mg/dL (ref 8.5–10.4)
CHLORIDE: 105 mmol/L (ref 96–111)
CO2 TOTAL: 23 mmol/L (ref 22–32)
CREATININE: 1.23 mg/dL (ref 0.62–1.27)
ESTIMATED GFR: >59 mL/min/1.73mˆ2 (ref >59)
GLUCOSE: 136 mg/dL (ref 65–139)
POTASSIUM: 4.7 mmol/L (ref 3.5–5.1)
SODIUM: 136 mmol/L (ref 136–145)

## 2015-06-28 LAB — URINALYSIS, MACROSCOPIC
BILIRUBIN: NEGATIVE mg/dL
COLOR: NORMAL
GLUCOSE: NEGATIVE mg/dL
KETONES: NEGATIVE mg/dL
NITRITE: NEGATIVE
PH: 5 (ref 5.0–8.0)
PROTEIN: 30 mg/dL — AB
SPECIFIC GRAVITY: 1.04 — ABNORMAL HIGH (ref 1.005–1.030)
UROBILINOGEN: NEGATIVE mg/dL

## 2015-06-28 LAB — DARK GREEN TUBE

## 2015-06-28 LAB — CBC
HCT: 35.9 % — ABNORMAL LOW (ref 36.7–47.0)
HGB: 11.9 g/dL — ABNORMAL LOW (ref 12.5–16.3)
MCH: 31.8 pg (ref 27.4–33.0)
MCHC: 33.2 g/dL (ref 32.5–35.8)
MCV: 95.8 fL (ref 78.0–100.0)
MPV: 9.1 fL (ref 7.5–11.5)
PLATELETS: 243 x10ˆ3/uL (ref 140–450)
RBC: 3.75 x10ˆ6/uL — ABNORMAL LOW (ref 4.06–5.63)
RDW: 13.2 % (ref 12.0–15.0)
WBC: 9.4 10*3/uL (ref 3.5–11.0)
WBC: 9.4 x10ˆ3/uL (ref 3.5–11.0)

## 2015-06-28 LAB — RED TOP TUBE

## 2015-06-28 LAB — URINALYSIS, MICROSCOPIC
RBCS: 2 /HPF (ref <6.0)
WBCS: 1 /HPF (ref <4.0)

## 2015-06-28 MED ORDER — ONDANSETRON HCL (PF) 4 MG/2 ML INJECTION SOLUTION
Freq: Once | INTRAMUSCULAR | Status: DC | PRN
Start: 2015-06-28 — End: 2015-06-28
  Administered 2015-06-28: 4 mg via INTRAVENOUS

## 2015-06-28 MED ORDER — OXYCODONE-ACETAMINOPHEN 5 MG-325 MG TABLET
1.0000 | ORAL_TABLET | ORAL | Status: DC | PRN
Start: 2015-06-28 — End: 2015-06-29

## 2015-06-28 MED ORDER — HYDROMORPHONE 1 MG/ML INJECTION WRAPPER
0.40 mg | INJECTION | INTRAMUSCULAR | Status: DC | PRN
Start: 2015-06-28 — End: 2015-06-28

## 2015-06-28 MED ORDER — SODIUM CHLORIDE 0.9 % IRRIGATION SOLUTION
1000.00 mL | Status: DC | PRN
Start: 2015-06-28 — End: 2015-06-28
  Administered 2015-06-28: 1000 mL

## 2015-06-28 MED ORDER — HYDROMORPHONE 1 MG/ML INJECTION WRAPPER
0.20 mg | INJECTION | INTRAMUSCULAR | Status: DC | PRN
Start: 2015-06-28 — End: 2015-06-28

## 2015-06-28 MED ORDER — HYDROMORPHONE 1 MG/ML INJECTION WRAPPER
INJECTION | Freq: Once | INTRAMUSCULAR | Status: DC | PRN
Start: 2015-06-28 — End: 2015-06-28
  Administered 2015-06-28: 0.2 mg via INTRAVENOUS

## 2015-06-28 MED ORDER — DIPHTH,PERTUSSIS(ACELL),TETANUS 2.5 LF UNIT-8 MCG-5 LF/0.5 ML IM SUSP
0.5000 mL | INTRAMUSCULAR | Status: AC
Start: 2015-06-28 — End: 2015-06-28
  Administered 2015-06-28: 0.5 mL via INTRAMUSCULAR
  Filled 2015-06-28: qty 1

## 2015-06-28 MED ORDER — CEFAZOLIN 10 GRAM SOLUTION FOR INJECTION
2.0000 g | Freq: Three times a day (TID) | INTRAMUSCULAR | Status: AC
Start: 2015-06-28 — End: 2015-06-29
  Administered 2015-06-28 – 2015-06-29 (×4): 2 g via INTRAVENOUS
  Administered 2015-06-29: 0 g via INTRAVENOUS
  Administered 2015-06-29: 2 g via INTRAVENOUS
  Filled 2015-06-28 (×5): qty 20

## 2015-06-28 MED ORDER — DEXAMETHASONE SODIUM PHOSPHATE 4 MG/ML INJECTION SOLUTION
Freq: Once | INTRAMUSCULAR | Status: DC | PRN
Start: 2015-06-28 — End: 2015-06-28
  Administered 2015-06-28: 4 mg via INTRAVENOUS

## 2015-06-28 MED ORDER — NEOSTIGMINE METHYLSULFATE 1 MG/ML INTRAVENOUS SOLUTION
Freq: Once | INTRAVENOUS | Status: DC | PRN
Start: 2015-06-28 — End: 2015-06-28
  Administered 2015-06-28: 5 mg via INTRAVENOUS

## 2015-06-28 MED ORDER — PROMETHAZINE 25 MG/ML INJECTION SOLUTION
6.25 mg | Freq: Once | INTRAMUSCULAR | Status: DC | PRN
Start: 2015-06-28 — End: 2015-06-28

## 2015-06-28 MED ORDER — DULOXETINE 30 MG CAPSULE,DELAYED RELEASE
30.0000 mg | DELAYED_RELEASE_CAPSULE | Freq: Every day | ORAL | Status: DC
Start: 2015-06-28 — End: 2015-07-01
  Administered 2015-06-28 – 2015-07-01 (×4): 30 mg via ORAL
  Filled 2015-06-28 (×4): qty 1

## 2015-06-28 MED ORDER — MULTIVITAMIN TABLET
1.0000 | ORAL_TABLET | Freq: Every day | ORAL | Status: DC
Start: 2015-06-28 — End: 2015-07-01
  Administered 2015-06-28 – 2015-07-01 (×4): 1 via ORAL
  Filled 2015-06-28 (×4): qty 1

## 2015-06-28 MED ORDER — SIMVASTATIN 20 MG TABLET
40.00 mg | ORAL_TABLET | Freq: Every evening | ORAL | Status: DC
Start: 2015-06-28 — End: 2015-07-01
  Administered 2015-06-28 – 2015-06-30 (×3): 40 mg via ORAL
  Filled 2015-06-28 (×4): qty 2

## 2015-06-28 MED ORDER — OXYCODONE-ACETAMINOPHEN 5 MG-325 MG TABLET
2.0000 | ORAL_TABLET | ORAL | Status: DC | PRN
Start: 2015-06-28 — End: 2015-06-29
  Administered 2015-06-28 – 2015-06-29 (×5): 2 via ORAL
  Filled 2015-06-28 (×5): qty 2

## 2015-06-28 MED ORDER — PHENYLEPHRINE 60MG IN NS 250ML INFUSION - FOR ANES
INTRAVENOUS | Status: DC | PRN
Start: 2015-06-27 — End: 2015-06-28
  Administered 2015-06-27: 0.3 ug/kg/min via INTRAVENOUS
  Administered 2015-06-28: 0.5 ug/kg/min via INTRAVENOUS

## 2015-06-28 MED ORDER — ONDANSETRON HCL (PF) 4 MG/2 ML INJECTION SOLUTION
4.00 mg | Freq: Once | INTRAMUSCULAR | Status: DC | PRN
Start: 2015-06-28 — End: 2015-06-28

## 2015-06-28 MED ORDER — GLYCOPYRROLATE 0.2 MG/ML INJECTION SOLUTION
Freq: Once | INTRAMUSCULAR | Status: DC | PRN
Start: 2015-06-28 — End: 2015-06-28
  Administered 2015-06-28: .5 mg via INTRAVENOUS

## 2015-06-28 MED ADMIN — oxyCODONE-acetaminophen 5 mg-325 mg tablet: @ 16:00:00

## 2015-06-28 MED ADMIN — nystatin 100,000 unit/gram topical powder: INTRAVENOUS | @ 06:00:00 | NDC 00574200815

## 2015-06-28 MED ADMIN — artificial tears with lanolin eye ointment: ORAL | @ 09:00:00 | NDC 00904516838

## 2015-06-28 MED ADMIN — sodium chloride 0.9 % intravenous solution: ORAL | @ 21:00:00 | NDC 00338004904

## 2015-06-28 NOTE — Anesthesia Transfer of Care (Cosign Needed)
ANESTHESIA TRANSFER OF CARE NOTE                Last Vitals: Temperature: 36.1 C (97 F) (06/28/15 0130)  Heart Rate: 93 (06/28/15 0130)  BP (Non-Invasive): (!) 150/91 mmHg (06/28/15 0130)  Respiratory Rate: 14 (06/28/15 0130)  SpO2-1: 100 % (06/28/15 0130)  Pain Score (Numeric, Faces): 3 (06/27/15 2312)    Patient transferred to PACU 1 in stable condition. Report given to RN.    7/25/2016at 01:31.

## 2015-06-28 NOTE — Brief Op Note (Signed)
[facility]                                                     BRIEF OPERATIVE NOTE    Patient Name: [facility] Number: [id number]  Date of Service: 06/28/2015   Date of Birth: 04-03-1966      Pre-Operative Diagnosis: 1. Open left femoral shaft fx.  2. Closed right femoral shaft fx   Post-Operative Diagnosis: Same  Procedure(s)/Description:  1. I&D open left femur fx.  2. IMN left femoral shaft fx.  3. IMN right femoral shaft fx  Findings/Complexity (inherent to the procedure performed): As above     Attending Surgeon: Mercy Riding  Assistant(s): Albertine Patricia, Shepet    Anesthesia Type: General endotracheal anesthesia  Estimated Blood Loss:  200 ml  Blood Given: None  Fluids Given: 900 cc LR  urine output: 170 cc  Complications (not routinely expected or not inherent to difficulty/nature of procedure):None  Characteristic Event (routinely expected or inherent to the difficulty/nature of the procedure): None  Did the use of current and/or prior Anticoagulants impact the outcome of the case? N\A  Wound Class: Contaminated Wounds-Open, fresh, accidental wounds    Implants: Synthes 10 x 400 mm retrograde femoral nail x 2           Disposition: PACU - hemodynamically stable.  Condition: stable    Ernestine Conrad, MD 06/28/2015, 00:54

## 2015-06-28 NOTE — OR PreOp (Signed)
Patient transferred to Pre-op area from emergency department.  ED tech unable to locate patient's mobile phone in patient belongings bag.  Attempted to call patient's mobile device with no answer.  Patient's cousin and coworker at bedside and unable to locate patient's mobile phone.

## 2015-06-28 NOTE — PT Evaluation (Signed)
[facility]  Physical Therapy Initial Evaluation    Patient Name: [name]  Date of Birth: [birth year]  Height:  188 cm (6' 2")  Weight: 102.059 kg (225 lb)  Room/Bed: 980/B  Payor: WORKER'S COMPENSATION / Plan: OTHER WORK COMP SELF FUNDED / Product Type: Work Comp Editor, commissioning /      Date/Time of Admission: 06/27/2015  9:28 PM  Admitting Diagnosis:  Trauma [T14.90]                 Past Medical History   Diagnosis Date    Wears glasses     Hypercholesteremia          Past Surgical History   Procedure Laterality Date    Hx tonsillectomy      Hx foot surgery       bone spur removal          reports that he has been smoking Cigarettes.  He has been smoking about 0.25 packs per day. He does not have any smokeless tobacco history on file. He reports that he drinks alcohol.  History   Smoking status    Current Every Day Smoker -- 0.25 packs/day    Types: Cigarettes   Smokeless tobacco    Not on file       Precautions: WBAT BLE    Subjective/Objective:       Flowsheet Info:    06/28/15 0940   Therapist Pager   PT Assigned/ Pager # [phone]   Rehab Session   Document Type evaluation   Total PT Minutes: 13   General Information   Patient Profile Review yes   General Observations: Pt supine in bed with wife at bedside.    Pertinent History Of Current Problem s/p crush injury, Bilateral femeral IMN   Precautions/Limitations weight bearing restriction;fall precautions   Weight-Bearing Status   Extremity Weight-Bearing Status left lower extremity;right lower extremity   Left Lower Extremity Weight-Bearing weight-bearing as tolerated   Right Lower Extremity Weight-Bearing weight-bearing as tolerated   Mutuality/Individual Preferences   Patient Specific Preferences "[name]"   Patient Specific Goals get better   Patient Specific Interventions aarom, eob with min a x 1-2, stand with mod a x 2 and fww   What Anxieties, Fears or Concerns Do You Have About Your Health or  Care? permanent damage to legs   What Information Would Help [place] Give You More Personalized Care? I PTA   Living Environment   Lives With spouse   Living Arrangements house   Home Assessment: No Problems Identified   Home Accessibility stairs to enter home   Number of Stairs to [place] 4   Transportation Available family or friend will provide   Functional Level Prior   Ambulation 0-->independent   Transferring 0-->independent   Toileting 0-->independent   Pre Treatment Status   Pre Treatment Patient Status Patient supine in bed   Support Present Pre Treatment  Family present   Cognitive Assessment/Intervention   Behavior/Mood Observations behavior appropriate to situation, WNL/WFL   Orientation Status (Cognitive) oriented x 4   Pain Assessment   Pain Comment (Pre/Post Treatment Pain) pt has pain with movement of legs, he is hesistant to use PCA.    RUE Assessment   RUE Assessment WFL- Within Functional Limits   LUE Assessment   LUE Assessment WFL- Within Functional Limits   RLE Assessment   RLE Assessment X-Exceptions   RLE ROM limited and painful.    RLE  Strength quad strength 2/5 able to hold legs against gravity   LLE Assessment   LLE Assessment X-Exceptions   LLE ROM decreased. knee flexion is very limited and painful.    LLE Strength able to hold knee ext against gravity.    Trunk Assessment   Trunk Assessment WFL-Within Functional Limits   Bed Mobility Assessment/Treatment   Supine-to-Sit Independence moderate assist (50% patient effort);2 person assist required   Sit to Supine, Independence maximum assist (25% patient effort);2 person assist required   Transfer Assessment/Treatment   Sit-Stand Independence moderate assist (50% patient effort);2 person assist required   Transfer Comment in standing with fww was able to work on Weight shift and accept weight on both legs.    Gait Assessment/Treatment   Comment  pt did some steps in place at fww with mod a for balance   Motor Skills/Interventions   Additional  Documentation Therapeutic Exercise (Group)   Balance Skills Training   Sitting Balance: Static fair balance   Sitting Balance: Dynamic fair balance   Sit-to-Stand Balance fair - balance   Standing Balance: Static fair - balance   Standing Balance: Dynamic fair - balance   Therapeutic Exercise   Lower Extremity ankle pumps   Lower Extremity Range of Motion hip flexion-extension, bilateral;hip abduction-adduction, bilateral;knee flexion-extension, bilateral;ankle dorsiflexion-plantar flexion bilateral  (pt given a leg lifter to work on ROM)   Sets/Reps 10   Post Treatment Status   Post Treatment Patient Status Patient supine in bed  (chair not appropriate at this time due to being low)   Support Present Post Treatment  Family present   Plan of Care Review   Plan Of Care Reviewed With patient   Clinical Impression   Assessment pt is very motivated and worked very hard. Pt has increased pain with all mobility. He was able to accept weight on BLE. he was able to come to standing and do weight shifts. Pt did not transfer to chair at this time due to pain and feeling that chair was too low for patient to get out of at this time. pt would be good candiate for further PT.    Criteria for Skilled Therapeutic Interventions Met yes   Pathology/Pathophysiology Noted (Describe Specifically for Each System) musculoskeletal   Impairments Found (describe specific impairments) gait, locomotion, and balance;muscle performance;ROM (range of motion)   Functional Limitations in Following Categories (Describe Specific Limitations) self-care;home management;work;community/leisure   Disability: Inability to Perform Actions/Activities of Required Roles (describe specific disability) community/leisure   Rehab Potential good, to achieve stated therapy goals   Therapy Frequency minimum of 3x/week   Predicted Duration of Therapy Intervention (days/wks) until goals are met   Anticipated Discharge Disposition inpatient rehabilitation facility      Planned Therapy Interventions   Planned Therapy Interventions balance training;gait training;joint mobilization;home exercise program;manual therapy techniques;ROM (range of motion);transfer training               Goals:     Treatment Goals:     -Patient will transfer bed to chair with min a x 1.    -Patient will ambulate 20 feet with fww by D/C.    -Pt will demonstrate Fair + balance by d/c to home.     -Patient will ambulate up and down 4 steps with supervision by D/C to home    Discharge Needs:   Equipment Recommendations: [place]  Further Treatment Recommendations: Acute Rehab    The patient presents with mobility limitations due to impaired range of  motion, weight bearing restrictions and impaired functional activity tolerance that significantly impair/prevent patients ability to participate in mobility-related activities of daily living (MRADLs) including ambulation and transfers in order to safely complete, toileting, bathing, food preparation. This functional mobility deficit can be sufficiently resolved with the use of a front wheel walker in order to decrease the risk of falls, morbidity, and mortality in performance of these MRADLs. Patient is able to safely use this assistive device.   JUSTIFICATION OF DISCHARGE RECOMMENDATION  Based on current diagnosis, functional performance prior to admission, and current functional performance, this patient requires continued PT services in [facility] in order to achieve significant functional improvements in these deficit areas: Balance, Gait, Safety and Strength.      Therapist :     Therapist:  Silvana Newness PT  Pager #:  [phone]

## 2015-06-28 NOTE — OR PostOp (Signed)
Patient's wife at bedside and has patient's cell phone.

## 2015-06-28 NOTE — Ancillary Notes (Signed)
SBIRT    SBIRT completed, as per [place] SBIRT model.  Pending recommendations: None    Jaella Weinert M Reyce Lubeck, THERAPIST  Pager  #[phone]

## 2015-06-28 NOTE — OR Surgeon (Signed)
[facility]                                  DEPARTMENT OF ORTHOPAEDICS                                     OPERATION SUMMARY    PATIENT NAME: [name], [name] [facility] VHQION:[id number]  DATE OF SERVICE:06/28/2015  DATE OF BIRTH: 1965/12/07    PREOPERATIVE DIAGNOSES:  1.  Open left femoral shaft fracture.  2.  Closed right femoral shaft fracture.    POSTOPERATIVE DIAGNOSIS:     1.  Open left femoral shaft fracture.  2.  Closed right femoral shaft fracture.    NAME OF PROCEDURES:  1.  Irrigation and debridement, open left femoral shaft fracture.  2.  Retrograde intramedullary nailing of left femur fracture.  3.  Retrograde intramedullary nailing of right femur fracture.    SURGEONS:  Maggie Font, MD (staff), Clarisa Schools, MD (assistant), Camillia Herter, MD (assistant).    ESTIMATED BLOOD LOSS:  200 mL.    FLUIDS:  900 mL lactated Ringer's.    DRAINS:  None.    SPONGE AND NEEDLE COUNT:  Correct.    MATERIAL TO LAB:  No.    IMPLANTS:  Synthes 10 x 400 mm retrograde femoral nail x2.    INDICATIONS FOR PROCEDURE:  The patient is a 49 year old male who sustained a work-related injury when a sand mixer pinned his legs.  He had immediate pain and deformity in both thighs.  He was brought to [facility] as a trauma patient.  He was awake and alert.  Physical exam of both lower extremities revealed that he was neurovascularly intact distally.  He had obvious pain and deformity about his distal thigh.  There was a 3-4 mm poke hole wound on the posterolateral aspect of his left thigh with dark hematoma leaking from it.  Radiographs revealed transverse femoral shaft fractures with marked displacement and shortening on both sides.  Options were discussed extensively with the patient who preferred surgical treatment of his injuries.    DESCRIPTION OF PROCEDURE:  The patient was correctly identified in the anesthesia holding area.  Informed consent had been  obtained preoperatively.  The patient was then transported to the operating where he was placed under general anesthesia.  He received 2 grams of IV Ancef preoperatively.  Both lower extremities were prepped and draped in usual sterile fashion.  Initially, the poke hole on the posterolateral aspect of left thigh was opened with a knife sharply superiorly and inferiorly.  There was a small amount of vitalized subcutaneous tissue that was excised sharply with a knife.  There was some free small bone fragments that were irrigated out of the wound at that point and the wound was irrigated copiously.  There was no obvious contamination.  After adequate irrigation of the wound, it was closed with interrupted full-thickness 3-0 nylon sutures.  A longitudinal incision was made along the anteromedial aspect of the knee, parallel to the patellar tendon.  The dissection was carried sharply down along the medial aspect of the patellar tendon.  The knee joint was opened and then a guide pin was placed at the very tip of Blumensaat line.  This was checked on the AP and lateral views and when the pin was in the  appropriate position was advanced into the supracondylar region of the femur and then an opening reamer was placed over that and the femoral canal opened.  A ball-tipped guidewire was then placed in the distal femur and then traction was placed on the left lower extremity and the femoral fracture was lined up and the ball-tipped guidewire was passed across the femoral fracture into the intramedullary canal of the proximal shaft.  This was again checked with intraoperative fluoroscopy on the AP and lateral views.  The guidewire was passed up into the subtrochanteric region of the proximal femur.  This again was checked with intraoperative fluoroscopy.  The measurement was taken and it was felt that a 400 mm length nail would be appropriate, so with the fracture in a reduced position, the femoral canal was sequentially reamed  to 11.5 mm.  There was extensive chatter in the intramedullary canal that started about the 10.5 mm, so it was felt that a 10-mm nail was acceptable.  That nail was opened and assembled on the insertion device and then the nail was placed over the guidewire and gently inserted into the femoral canal across the fracture site.  This was carefully visualized and the fracture lined up well on the AP and lateral views.  The nail was inserted to its appropriate extent.  The guidewire was removed and 2 distal locking screws were placed and their position was checked with intraoperative fluoroscopy and then the proximal end of the nail was placed with the percutaneous screw using fluoroscopy for guidance.  That locking screw was also checked.  At this point, intraoperative fluoroscopy confirmed acceptable fracture alignment on AP and lateral views and also the hardware placement and interlocking screw placement were acceptable.  The femoral neck was checked and noted to be intact.  So then the wounds on that leg were irrigated copiously and closed with interrupted Vicryl sutures in subcutaneous tissue and 3-0 nylon sutures and skin staples.  The right lower extremity was addressed.  A longitudinal incision was made along the anteromedial aspect of the knee along the patellar tendon.  Sharp dissection was carried down along the medial aspect of the patellar tendon.  The joint was opened and then a guide pin was placed at the tip of Blumensaat line and this was checked on the AP and lateral views and the guide pin advanced into the supracondylar region of the femur.  The opening reamer was then placed over the guidewire and into the distal femur.  That was removed and ball-tipped guidewire was placed through the distal hole and traction was placed on the lower extremity and the fracture aligned and the ball-tipped guidewire was passed into the intramedullary canal of the proximal fragment.  This was again checked with  intraoperative fluoroscopy on the AP and lateral views.  The guidewire was advanced into the subtrochanteric region of the proximal femur and length was checked again with intraoperative fluoroscopy, and it was found that 400 mm nail was acceptable for this side also.  With the fracture in a reduced position, the femoral canal sequentially reamed to 11.5 mm and again it was noted that there was extensive bone chatter at about the 10-10.5 mm diameter, so a 10 mm nail was selected.  That was then assembled on the back table and then passed over the guidewire and gently inserted into the distal femur.  The fracture alignment was checked as the nail was passed across the fracture and was acceptable.  The nail was advanced  to the appropriate extent.  The guidewire was removed and 2 distal locking screws were placed and then proximally a single locking screw was placed percutaneously with intraoperative fluoroscopy for guidance.  Once that was accomplished, intraoperative fluoroscopy confirmed acceptable fracture alignment on the AP and lateral views and again the hardware was in appropriate position along with intramedullary locking screws.  The femoral neck was visualized on the right side and also noted to be intact.  The wounds were all irrigated copiously and closed with interrupted Vicryl sutures in subcutaneous tissue and skin staples.  At this time, anterior and posterior drawer was performed on both knees and noted to be stable.  The wounds were then dressed with Xeroform, 4 x 4 sponges and Cover-Roll tape.  TED hose were placed on both lower extremities as were sequential compression devices.  Limb length and rotation were evaluated and felt to be acceptable and symmetric.  The patient was then awakened from his general anesthesia without difficulties.  He was placed on a recovery room bed and removed from the operating room in good condition.    POSTOPERATIVE PLAN:  The patient will remain admitted for pain  management and IV antibiotics.  He will be gradually mobilized and discharged when stable.  He will follow up in 2 weeks for wound check and staple removal along with the suture removal from the posterolateral left thigh.  He will need AP and lateral x-rays of both femurs at that time.      Maggie Font, MD  Assistant Professor  [facility] Department of Orthopaedics    [id number]; D: 06/28/2015 01:08:36; T: 06/28/2015 56:21:30

## 2015-06-28 NOTE — Care Management Notes (Signed)
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    Pharmacy Info:   Preferred Pharmacy     None        Emergency Contact Info:   Extended Emergency Contact Information  Primary Emergency Contact: Letitia Caul  Address: [address]           [place], [place] [address] [place]  Mobile Phone: [phone]  Relation: Wife    History:   [name] is a 49 y.o., male, admitted S/P machine accident    Height/Weight: 188 cm ( ) / 102.059 kg (225 lb)     LOS: 1 day   Admitting Diagnosis: Trauma [T14.90]    Assessment:      06/28/15 1555   Assessment Details   Assessment Type Admission   Date of Care Management Update 06/28/15   Date of Next DCP Update 07/01/15   Care Management Plan   Discharge Planning Status initial meeting   Projected Discharge Date 07/02/15   Discharge Needs Assessment   Equipment Currently Used at Home none   Equipment Needed After Discharge other (see comments)  (to be determined)   Discharge Facility/Level Of Care Needs Acute Rehab Placement/Return (not psych)(code 62)   Transportation Available family or friend will provide   Referral Information   Admission Type inpatient   Address Verified verified-no changes   Insurance Verified verified-no change   ADVANCE DIRECTIVES   Does the Patient have an Advance Directive? 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  To avoid falling but overcompensated and fell forward resulting in getting lodged and crushed in the machine at thigh level. When extricated, witnesses noted his thighs were grossly deformed. He arrived to [facility] from scene by helicopter. He was GCS 15 and vitally stable. He denies LOC or hitting his head, neck, or back  Bilateral femur fractures with IM nail and knee effusions.    CCC in to see patient at bedside.  Patient not sure of WC contact. CM assessment completed.  HR rep from patient's company called and she will call as soon as she has WC claim number and Case manager information.      Discharge Plan:  Acute Rehab Placement/Return (not psych) (code 43)  Will follow discharge recommendations.  Will obtain choice from patient for acute rehab.    The patient will continue to be evaluated for developing discharge needs.     Case Manager: Herschel Senegal, RN 06/28/2015, 15:59  Phone: [phone]

## 2015-06-28 NOTE — Progress Notes (Addendum)
[facility] Exam Note    Date of Birth:  06/25/1966  Date of Admission:  06/27/2015  Date of service: 06/28/2015    [name], 49 y.o., male Post trauma day 1 status post crush injury of the  bilateral thigh.    Patient mental exam adequate for full/accurate tertiary exam: Yes    Events over the last 24 hours have included:   AP Chest X-Ray  Negative for trauma  AP Pelvis X-Ray  Negative for trauma  FAST examination  Negative for trauma  CT Chest, Abdomen & Pelvis  Negative for trauma  XR bilateral femur and knee  Positive bilateral femur fractures and knee effusions   XR bilateral tib fib Negative for trauma      EKG shows NSR    Ortho consult and taken to OR for I&D left thigh and IM Nail bilateral femur    Subjective:    New complaints since initial examination:  None  Pain controlled: No,  Action - percocet added    Objective   24 Hour Summary:    Filed Vitals:    06/28/15 0237 06/28/15 0600 06/28/15 0800 06/28/15 1040   BP: 91/52 122/81  126/73   Pulse: 75 90  102   Temp:  37.1 C (98.8 F)  37.1 C (98.8 F)   Resp: 19 18  20    SpO2: 98%  97% 94%       Labs:  Recent Labs      06/27/15   2127  06/27/15   2127  06/27/15   2138  06/28/15   0530  06/28/15   0554  06/28/15   0941   WBC   --   14.9*   --    --    --   9.4   HGB   --   14.0   --    --    --   11.9*   HCT   --   42.2   --    --    --   35.9*   SODIUM   --    --   138  136   --    --    POTASSIUM   --    --    --   4.7   --    --    CHLORIDE   --    --   107  105   --    --    BICARBONATE   --    --   24.6   --    --    --    BUN  18   --    --   17   --    --    CREATININE  1.36*   --    --   1.23   --    --    GLUCOSE   --    --   112*   --   Negative   --    ANIONGAP   --    --    --   8   --    --    CALCIUM   --    --    --   9.2   --    --    INR  1.02   --    --    --    --    --  In: 1200.2 [P.O.:300; I.V.:900.2]  Out: 1050 [Urine:820; Blood:200; NG/OG/GT:30]  Recent Labs      06/27/15   2138    PH  7.32   PCO2  57.00*   PO2  24.0*   BASEEXCESS  1.9       Radiology:   New radiology findings/updates since admission: No    Nutrition: DIET REGULAR  MNT PROTOCOL FOR DIETITIAN     Current Medications:    Current Facility-Administered Medications:  ceFAZolin (ANCEF) 2 g in D5W 50 mL IVPB 2 g Intravenous Q8H   docusate sodium (COLACE) capsule 100 mg Oral 2x/day   DULoxetine (CYMBALTA) delayed release capsule 30 mg Oral Daily   enoxaparin PF (LOVENOX) 30 mg/0.3 mL SubQ injection 30 mg Subcutaneous Q12H   HYDROmorphone (DILAUDID) 1 mg/mL (tot vol 100 mL) in NS PCA 100 mL Intravenous Q1H PRN   LR premix infusion  Intravenous Continuous   magnesium hydroxide (MILK OF MAGNESIA)  per 5mL oral liquid 15 mL Oral Q72H   methocarbamol (ROBAXIN) tablet 1,000 mg Oral 4x/day   multivitamin tablet 1 Tab Oral Daily   ondansetron (ZOFRAN) 2 mg/mL injection 4 mg Intravenous Q8H PRN   oxyCODONE-acetaminophen (PERCOCET) 5-325mg  per tablet 1 Tab Oral Q4H PRN   Or      oxyCODONE-acetaminophen (PERCOCET) 5-325mg  per tablet 2 Tab Oral Q4H PRN   sennosides-docusate sodium (SENOKOT-S) 8.6-50mg  per tablet 1 Tab Oral 2x/day   simvastatin (ZOCOR) tablet 40 mg Oral QPM       Today's Physical Exam:  GEN:   NAD  HEENT:   Normocephalic; atraumatic.  PULM:   Lung sounds clear to auscultation bilaterally.  Normal respiratory effort.    CV:   Regular rate and rhythm.  ABD:   Abdomen soft, nontender, and nondistended.    MS:   Distal pulses intact.  Normal strength and range of motion of BUE. Limited ROM BLE secondary to fracture/thigh pain. OR dressings intact to lateral thighs and knees. TED hose on.  NEURO:   Alert and oriented to person, place and time.   Vascular:  All pulses palpable and equal bilaterally.  DP/PT pulses palpable and equal bilaterally.  Integumentary:  Pink, warm, and dry.    New Physical Exam findings: no  Actions based upon new findings: NA    Assessment/ Plan:   Active Hospital Problems   (*Primary Problem)     Diagnosis    *Crush accident     Crushed at thigh level by sand mixer       Fracture of left femur     Mid-shaft  Ortho consult  OR I&D and IM Nail  WBAT      Fracture of right femur     Mid-shaft  Ortho consult  OR IM Nail      Trauma     DVT prophylaxis:  Lovenox and SCDs/ Venodynes  Nutrition: DIET REGULAR  MNT PROTOCOL FOR DIETITIAN diet, Last Bowel Movement: 06/26/15  Activity: OOB with assist; WBAT BLE  Pain: dilaudid PCA; percocet, robaxin  Social: family at bedside    Plan:   -creatinine slightly elevated upon admit; normal this am  -MIVF rate decreased to [facility] for PCA carrier  -remove foley  -pain control: plan DC PCA tomorrow  -bowel regimen  -home meds ordered  -PT/OT  -Hgb stable ; recheck in am  -transfer to floor    Vicki Mallet, APRN-FNP-BC 06/28/2015, 13:13       06/28/2015  I saw and examined the patient on  06/28/15.  I reviewed the PA's note.  I agree with the findings and plan of care as documented in the PA's note.  Any exceptions/additions are noted .  Bilateral Femur fx.  Will need rehab.    Merri Ray, MD 06/28/2015, 17:32

## 2015-06-28 NOTE — Nurses Notes (Signed)
Patient arrived on unit in stable conditions vital per flow sheet. Patient denies pain at current time. Spouse  At bedside. Orient to unit policies. Call button within reach.

## 2015-06-28 NOTE — Care Plan (Signed)

## 2015-06-28 NOTE — OT Evaluation (Signed)
[facility]  Occupational Therapy Initial Evaluation  Patient Name: [name]  Date of Birth: [birth year]  Height:  188 cm (_0 )  Weight:  102.059 kg (225 lb)  Room/Bed: 980/B  Payor: WORKER'S COMPENSATION / Plan: OTHER WORK COMP SELF FUNDED / Product Type: Work Comp Editor, commissioning /     Date/Time of Admission: 06/27/2015  9:28 PM  Admitting Diagnosis:  Trauma [T14.90]  ----------------------------------------------------------------------------------------------------------  OT is consulted for evaluation, treatment, and assessment of home safety/post discharge rehabilitation need.    Precautions: WBAT BLE, fall risk    Past Medical History   Diagnosis Date    Wears glasses     Hypercholesteremia          Past Surgical History   Procedure Laterality Date    Hx tonsillectomy      Hx foot surgery       bone spur removal         History     Social History Narrative       Subjective/Objective:        06/28/15 0942   Therapist Pager   OT Assigned/ Pager # Antione Obar [phone]   Rehab Session   Document Type evaluation   General Information   Patient Profile Review yes   General Observations: RN approved session, patient agreeable. Wife present throughput. Seen along with professional assist of PT   Pertinent History Of Current Problem admitted after B LE crush injury. 1 Day Post-Op S/P bilateral retrograde femoral IMN   Medical Lines PIV Line   Respiratory Status room air   Precautions/Limitations fall precautions;weight bearing restriction  (WBAT B LE)   Pre Treatment Status   Pre Treatment Patient Status Patient supine in bed;Call light within reach;Telephone within reach   Support Present Pre Treatment  Family present   Mutuality/Individual Preferences   Patient Specific Preferences call me Dnaiel   Patient Specific Goals get better and go home   Patient Specific Interventions sit on EOB with assist of 1-2 for supine<>sit   What Anxieties, Fears or Concerns Do You Have  About Your Health or Care? pain control   What Questions Do You Have About Your Health or Care? could anything have been permanently damaged from being crushed for an hour?   What Information Would Help [place] Give You More Personalized Care? nothing   Living Environment   Lives With spouse;child(ren), dependent;child(ren), adult   Living Arrangements house   Home Accessibility stairs to enter home   Number of Stairs to [place] 4   Transportation Available family or friend will provide   Functional Level Prior   Ambulation 0-->independent   Transferring 0-->independent   Toileting 0-->independent   Bathing 0-->independent   Dressing 0-->independent   Eating 0-->independent   Communication 0-->understands/communicates without difficulty   Prior Functional Level Comment full independent and working   Self-Care   Dominant Hand right   Pain Assessment   Pain Comment (Pre/Post Treatment Pain) B LE 2/10 at rest, not rated with movement. Using PCA as available and had oral pain medication prior to session   Coping/Psychosocial   Observed Emotional State calm;cooperative   Verbalized Emotional State acceptance   Family/Support System   Family/Support System, Person(s) spouse   Involvement in Care at bedside;attentive to patient;interacting with patient;participating in care;supportive of patient   Coping/Psychosocial Response Interventions   Plan Of Care Reviewed With patient;spouse   Cognitive Assessment/Intervention   Behavior/Mood Observations behavior appropriate to situation, WNL/WFL  Orientation Status (Cognitive) oriented x 4   Attention WNL/WFL   Follows Commands/Answers Questions 100% of the time   Vision Assessment/Intervention   Visual Impairment WFL with corrective lenses   RUE Assessment   RUE Assessment WFL- Within Functional Limits   LUE Assessment   LUE Assessment WFL- Within Functional Limits   Musculoskeletal   LLE Weight-Bearing Status weight-bearing as tolerated   RLE Weight-Bearing Status weight-bearing  as tolerated   Bed Mobility Assessment/Treatment   Supine-to-Sit Independence moderate assist (50% patient effort)   Sit to Supine, Independence maximum assist (25% patient effort);2 person assist required   Transfer Assessment/Treatment   Sit-Stand Independence moderate assist (50% patient effort);2 person assist required  (elevated EOB)   Lower Body Dressing Assessment/Training   Position (LB Dressing Assessment/Training) sitting;standing   Independence Level (LB Dressing Assessment/Training) maximum assist (25% patient effort)   Balance Skills Training   Sitting Balance: Static fair balance   Sitting Balance: Dynamic fair - balance   Sit-to-Stand Balance fair - balance   Standing Balance: Static fair balance   Standing Balance: Dynamic fair - balance   Post Treatment Status   Post Treatment Patient Status Patient supine in bed;Call light within reach;Telephone within reach   Support Present Post Treatment  Family present   Clinical Impression   Functional Level at Time of Session Mr. Pongratz tolerated OT fairly well. Patient presents with B LE pain, decreased ROM, decreased balance, and decreased activity tolerance. Patient was fully independent PTA. Patient currently requires maxA for LE ADLs, and assist of two for bed mobility and STS transfers. Patient is expected to progress well with continued therapy, but is unsafe for return home at this time. Recommend acute rehab upon d/c.   Criteria for Skilled Therapeutic Interventions Met yes;treatment indicated   Rehab Potential good, to achieve stated therapy goals   Therapy Frequency 1x/day;minimum of 3x/week   Predicted Duration of Therapy Intervention (days/wks) until d/c from this facility    Anticipated Equipment Needs at Discharge to be determined   Anticipated Discharge Disposition inpatient rehabilitation facility        Goals:   By discharge, the patient will:  perform UE dressing, using A/E upon request, with supervision.  perform LE dressing, using A/E  upon request, with supervision.  participate in functional transfers from various surfaces (chair/bed/toilet) with supervision in order to complete ADL tasks.   participate in functional mobility using lest restrictive adaptive device, functional household distances or greater, with supervision or better to increase independence in ADL's and IADLs.    Discharge Needs:   Equipment Recommendations: to  be determined  Further Treatment Recommendations: Acute Rehab  JUSTIFICATION OF DISCHARGE RECOMMENDATION  Based on current diagnosis, functional performance prior to admission, and current functional performance, this patient requires continued PT and OT services in [facility] in order to achieve significant functional improvements in these deficit areas: Balance, Endurance, Gait, Safety and Self-Care ADLs and IADLs.      Therapist:   Johann Capers, OT,  06/28/2015  Pager #: [phone]  Evaluation Time: 49 minutes  The risks/benefits of therapy have been discussed with the patient/caregiver and he/she is in agreement with the established plan of care.   Time may include review of medical chart, obtaining patient's functional history from patient/family/medical staff/case management/ancillary personnel, collaboration on findings and treatment options (with the above mentioned individuals), re-assessment, and acute care rehabilitation.

## 2015-06-28 NOTE — Consults (Addendum)
[facility]  Orthopaedic Surgery - Post-op Note    Ortho Attending: Dr. Mercy Riding    Patient: [name]  DOB: 04/03/1966  MRN: [id number]  Date: 06/28/2015    SUBJECTIVE: Patient stable in PACU, pain controlled    OBJECTIVE:   Gen: NAD  Lungs: non-labored breathing  CV: pulses regular rate  Abd: non-distended  Msk:  BLE: dressing in place, c/d/i, cap refill <2s, wiggles all toes, SILT in all toes, plantar and dorsal foot     ASSESSMENT:   49 y.o. male s/p   1. Left open femur fx I&D and IMN  2. Right femur IMN    PLAN:   --Weightbearing status: WBAT BLE  --PT/OT: OOB  --Antibiotics: Ancef x48 hrs post-op  --Pain control: Per primary  --DVT prophylaxis: Lovenox starting tomorrow AM  --Diet: Regular  --Admission / Disposition: Admitted to Trauma, f/u 2 wks Dr. Mercy Riding (ordered)    --  Eugenie Birks, MD  Resident   [facility] Department of Orthopaedics  Pager # [phone]      Camillia Herter, MD 06/28/2015, 01:32

## 2015-06-28 NOTE — Care Plan (Signed)
Problem: General Plan of Care(Adult,OB)  Goal: Plan of Care Review(Adult,OB)  The patient and/or their representative will communicate an understanding of their plan of care  Outcome: Ongoing (see interventions/notes)  Functional Level at Time of Session Mr. Weiland tolerated OT fairly well. Patient presents with B LE pain, decreased ROM, decreased balance, and decreased activity tolerance. Patient was fully independent PTA. Patient currently requires maxA for LE ADLs, and assist of two for bed mobility and STS transfers. Patient is expected to progress well with continued therapy, but is unsafe for return home at this time. Recommend acute rehab upon d/c.   Criteria for Skilled Therapeutic Interventions Met yes;treatment indicated   Rehab Potential good, to achieve stated therapy goals   Therapy Frequency 1x/day;minimum of 3x/week   Predicted Duration of Therapy Intervention (days/wks) until d/c from this facility    Anticipated Equipment Needs at Discharge to be determined   Anticipated Discharge Disposition inpatient rehabilitation facility        Goals:   By discharge, the patient will:  perform UE dressing, using A/E upon request, with supervision.  perform LE dressing, using A/E upon request, with supervision.  participate in functional transfers from various surfaces (chair/bed/toilet) with supervision in order to complete ADL tasks.   participate in functional mobility using lest restrictive adaptive device, functional household distances or greater, with supervision or better to increase independence in ADL's and IADLs.     Discharge Needs:   Equipment Recommendations: to  be determined  Further Treatment Recommendations: Acute Rehab  JUSTIFICATION OF DISCHARGE RECOMMENDATION  Based on current diagnosis, functional performance prior to admission, and current functional performance, this patient requires continued PT and OT services in [facility] in order to achieve significant functional  improvements in these deficit areas: Balance, Endurance, Gait, Safety and Self-Care ADLs and IADLs.        Therapist:   Johann Capers, OT,  06/28/2015  Pager #: [phone]

## 2015-06-28 NOTE — Anesthesia Postprocedure Evaluation (Signed)
Late entry    ANESTHESIA POSTOP EVALUATION NOTE         06/28/2015     Last Vitals: Temperature: 37.1 C (98.8 F) (06/28/15 1938)  Heart Rate: 97 (06/28/15 1938)  BP (Non-Invasive): 124/74 mmHg (06/28/15 1938)  Respiratory Rate: 18 (06/28/15 1938)  SpO2-1: 90 % (06/28/15 1347)  Pain Score (Numeric, Faces): 4 (06/28/15 1921)    Procedures:   INSERTION NAIL TROCHANTERIC FEMUR (Bilateral )    Patient is sufficiently recovered from the effects of anesthesia to participate in the evaluation and has returned to their pre-procedure level.  I have reviewed and evaluated the following:  Respiratory Function: Consistent with pre anesthetic level  Cardiovascular Function: Consistent with pre anesthetic level  Mental Status: Return to pre anesthetic baseline level  Pain: Sufficiently controlled with medication  Nausea and Vomiting: Absent or sufficiently controlled with medication  Post-op Anesthetic Complications: None    Comment/ re-evaluation for any variations: None

## 2015-06-28 NOTE — Care Plan (Signed)
Problem: General Plan of Care(Adult,OB)  Goal: Plan of Care Review(Adult,OB)  The patient and/or their representative will communicate an understanding of their plan of care   Outcome: Ongoing (see interventions/notes)  Patient is a Financial controller on a sand mixer and was shoveling sand off the conveyer belt where it gets stuck near the hydraulic gate when a coworker accidentally opened the hydraulic gate and he lost his balance. He leaned forward to avoid falling but overcompensated and fell forward resulting in getting lodged and crushed in the machine at thigh level. When extricated, his thighs were grossly deformed. He arrived to [facility] from scene by helicopter. He was GCS 15 and vitally stable. He denies LOC or hitting his head, neck, or back.  Bilat femur IM nail with knee effusions.  OT recommendation is Acute Rehab. CCC will obtain choice and awaiting WC information.

## 2015-06-28 NOTE — OR PostOp (Signed)
Ok to initiate PCA in PACU as per Dr. Alona Bene.

## 2015-06-28 NOTE — Consults (Addendum)
Orthopaedic Trauma Progress Note    06/28/2015  1 Day Post-Op S/P bilateral retrograde femoral IMN    Subjective: resting comfortably in bed, feels much better now that femurs are fixed    Labs:   BMP:     Recent Labs      06/27/15   2127  06/27/15   2138   SODIUM   --   138   CHLORIDE   --   107   BUN  18   --    CREATININE  1.36*   --    GFR  59*   --      CBC Results Differential Results   Recent Labs      06/27/15   2127   WBC  14.9*   HGB  14.0   HCT  42.2   PLTCNT  257    Recent Results (from the past 30 hour(s))   CBC WITH DIFF    Collection Time: 06/27/15  9:27 PM   Result Value    WBC 14.9 (H)    NEUTROPHIL % 82    LYMPHOCYTE % 9    MONOCYTE % 7    EOSINOPHIL % 1    BASOPHIL % 1    BASOPHIL # 0.09          Physical Exam: Blood pressure 122/81, pulse 90, temperature 37.1 C (98.8 F), resp. rate 18, height 1.88 m ( ), weight 102.059 kg (225 lb), SpO2 98 %.  NAD  Non labored breathing  BLE:  Dressings c/d/i  Intact ankle pf/df, EHL/FHL  SILT distally on plantar/dorsal foot  2+ DP pulses    Assessment/ Plan:   [name] is a 49 y.o. male S/P bilateral retrograde femoral nails  WBAT BLE  PT/OT - ambulation assistance, gait training  DVT ppx - lovenox  Antibiotics - 48 hrs Ancef  Pain control  Diet - regular  Dispo - pending work with PT    Clarisa Schools, MD 06/28/2015, 06:18  [place] Dept of Orthopaedics  Pager # [phone]

## 2015-06-29 LAB — H & H
HCT: 32.7 % — ABNORMAL LOW (ref 36.7–47.0)
HGB: 10.8 g/dL — ABNORMAL LOW (ref 12.5–16.3)

## 2015-06-29 MED ORDER — ENOXAPARIN 60 MG/0.6 ML SUBCUTANEOUS SYRINGE
0.50 mg/kg | INJECTION | Freq: Two times a day (BID) | SUBCUTANEOUS | Status: DC
Start: 2015-06-29 — End: 2015-07-01
  Administered 2015-06-29 – 2015-07-01 (×4): 50 mg via SUBCUTANEOUS
  Filled 2015-06-29: qty 1
  Filled 2015-06-29 (×2): qty 0.6
  Filled 2015-06-29: qty 1
  Filled 2015-06-29: qty 0.6

## 2015-06-29 MED ORDER — MAGNESIUM HYDROXIDE 400 MG/5 ML ORAL SUSPENSION
15.00 mL | Freq: Three times a day (TID) | ORAL | Status: DC
Start: 2015-06-29 — End: 2015-07-01
  Administered 2015-06-29 (×2): 1200 mg via ORAL
  Administered 2015-06-30 (×2): 0 mg via ORAL
  Administered 2015-06-30 – 2015-07-01 (×2): 1200 mg via ORAL

## 2015-06-29 MED ORDER — OXYCODONE-ACETAMINOPHEN 5 MG-325 MG TABLET
2.0000 | ORAL_TABLET | ORAL | Status: DC
Start: 2015-06-29 — End: 2015-07-01
  Administered 2015-06-29 – 2015-07-01 (×14): 2 via ORAL
  Filled 2015-06-29 (×14): qty 2

## 2015-06-29 MED ORDER — POLYETHYLENE GLYCOL 3350 17 GRAM ORAL POWDER PACKET
17.0000 g | Freq: Every day | ORAL | Status: DC
Start: 2015-06-29 — End: 2015-07-01
  Administered 2015-06-29 – 2015-06-30 (×2): 17 g via ORAL
  Administered 2015-07-01: 0 g via ORAL
  Filled 2015-06-29 (×3): qty 1

## 2015-06-29 MED ORDER — HYDROMORPHONE 1 MG/ML INJECTION WRAPPER
0.2000 mg | INJECTION | INTRAMUSCULAR | Status: DC | PRN
Start: 2015-06-29 — End: 2015-06-30
  Administered 2015-06-29: 0.2 mg via INTRAVENOUS
  Filled 2015-06-29: qty 1

## 2015-06-29 MED ADMIN — sodium chloride 0.9 % intravenous solution: ORAL | @ 18:00:00 | NDC 00338004904

## 2015-06-29 MED ADMIN — sodium chloride 0.9 % intravenous solution: ORAL | @ 08:00:00 | NDC 00338004904

## 2015-06-29 MED ADMIN — cyclobenzaprine 10 mg tablet: ORAL | @ 08:00:00

## 2015-06-29 NOTE — Consults (Addendum)
Ortho Note  06/29/2015  Ortho Service: Sports    S/P bilateral retro femoral nails    Subjective: doing well this morning.  Does feel sore.  States he stood up at the bedside but had difficulty with any type of walking.      Objective:   Filed Vitals:    06/28/15 2100 06/28/15 2328 06/29/15 0100 06/29/15 0348   BP:  118/73  131/77   Pulse:  85  85   Temp:  37.3 C (99.1 F)  37.5 C (99.5 F)   Resp:  18  19   SpO2: 95%  95%      Lab Results   Component Value Date    WBC 9.4 06/28/2015    HGB 10.8* 06/29/2015    HGB 11.9* 06/28/2015    HGB 14.0 06/27/2015    HCT 32.7* 06/29/2015    PLTCNT 243 06/28/2015    INR 1.02 06/27/2015       BLE    Neuro Intact S/S/SP/DP/PT  Motor intact (dorsi/plantar flex, flex/extend toes)  Dressing clean and dry  2+ DP/PT     Plan:   Abx post op completed   Pain Control   PT/OT: WBAT BLE, encourage passive and active ROM to bilateral knees and hips   DVT Prophylaxis: activity/chemical prophylaxis per primary   Dispo: can discharge home from Ortho standpoint   F/u Dr. Mercy Riding 2 weeks    Burley Saver, [phone]  Resident, PGY-5  [place] Dept of Orthopaedics  Burley Saver, MD 06/29/2015, 06:00

## 2015-06-29 NOTE — Care Plan (Signed)
Problem: General Plan of Care(Adult,OB)  Goal: Plan of Care Review(Adult,OB)  The patient and/or their representative will communicate an understanding of their plan of care   Outcome: Ongoing (see interventions/notes)  Assessment:   Pt tolerated treatment well and is able to flex knees much more today. This allows for improved positioning for transfers however pt still struggles with WB on BLE due to pain. Burden of care such that pt not safe for home d/c. He is highly motivated and would do well in acute rehab setting. Will progress as tolerated while hospitalized.     JUSTIFICATION OF DISCHARGE RECOMMENDATION  Based on current diagnosis, functional performance prior to admission, and current  functional performance, this patient requires [facility] services   [facility] order to achieve significant functional improvements in these   deficit areas: Endurance, Gait, Safety and Strength.           Discharge Needs:   Equipment Recommendations: To Be Determined  Further Treatment Recommendations: Acute Rehab      Plan:   Continue to follow patient according to established plan of care.    The risks/benefits of therapy have been discussed with the patient/caregiver and he/she is in agreement with the established plan of care.       Therapist:   Rodney Langton, PT 06/29/2015 09:37  Pager #: [phone]

## 2015-06-29 NOTE — PT Treatment (Signed)
[facility]  Physical Therapy Progress Note    Patient Name: [name]  Date of Birth: 14-Jan-1966  Height:  188 cm (_0 )  Weight:  102.059 kg (225 lb)  Room/Bed: 757/A  Payor: WORKER'S COMPENSATION / Plan: OTHER WORK COMP SELF FUNDED / Product Type: Work Comp Editor, commissioning /     Date/Time of Admission: 06/27/2015  9:28 PM  Admitting Diagnosis:  Trauma [T14.90]      Subjective:   Pt and nurse agreeable to PT treatment. Seen in conjunction with OT. Pt reports pain to BLE 2-3/10 at rest, worsening with activity.     Objective:   Precautions:  Falls, WBAT BLE  Pt supine in bed, dressings and TED hose to BLE. Wife present and supportive  AAROM completed for knee flexion in supine using leg lifter. QS x 5 bilaterally for knee extension and quad activation. Seated knee flexion stretch with manual overpressure to approx 80deg bilaterally.    Bed Mobility: supine to sit minimal assistance of 1 with use of leg lifter and verbal instruction  Transfers: sit to stand from elevated EOB with modA x 2, pt struggled with WB on BLE while transitioning UE's to walker. Stand to sit CGA with step by step instruction  Balance: Sitting Dynamic Fair + and Static Good Standing Dynamic Fair and Static Fair  Ambulation: 4' bed to chair with FWW and CGA, gait slow and labored primarily WB through UE's on walker.   Therapeutic Exercise: pt to continue self stretch, AP and QS   Other: Pt left seated in chair with all needs met, BLE elevated with pillows under ankles, call button in reach. Rehab plans discussed with pt and wife.     Assessment:   Pt tolerated treatment well and is able to flex knees much more today. This allows for improved positioning for transfers however pt still struggles with WB on BLE due to pain. Burden of care such that pt not safe for home d/c. He is highly motivated and would do well in acute rehab setting. Will progress as tolerated while hospitalized.     JUSTIFICATION OF  DISCHARGE RECOMMENDATION  Based on current diagnosis, functional performance prior to admission, and current  functional performance, this patient requires continued PT services   in [facility] in order to achieve significant functional improvements in these   deficit areas: Endurance, Gait, Safety and Strength.         Discharge Needs:   Equipment Recommendations: To Be Determined  Further Treatment Recommendations: Acute Rehab    Plan:   Continue to follow patient according to established plan of care.    The risks/benefits of therapy have been discussed with the patient/caregiver and he/she is in agreement with the established plan of care.     Therapist:   Lyndle Herrlich, PT 06/29/2015 09:37  Pager #: [phone]  Treatment Time: 34 minutes

## 2015-06-29 NOTE — Care Management Notes (Signed)
WC information received , [facility] [employer] [facility] [id number] and worker Elita Quick [phone].  Information given to [facility].  CCC talked to patient and wife abut acute rehab.  Choice given and family would like [facility] [address].  Liaison for [facility] called and referral sent. Herschel Senegal, RN  06/29/2015, 12:33

## 2015-06-29 NOTE — Progress Notes (Addendum)
[facility]               Trauma Progress Note    Date of Birth:  [birth year]  Date of Admission:  06/27/2015  Date of service: 06/29/2015    [name], 49 y.o., male Post trauma day 1 status post crush injury.    Events over the last 24 hours have included:  No acute events overnight  Hgb 11.9-10.8    Subjective:    Patient complains of BLE knee pain.  Patient denies cp, sob,n /v/f.      Objective   24 Hour Summary:    Filed Vitals:    06/29/15 0348 06/29/15 0500 06/29/15 0729 06/29/15 0814   BP: 131/77  133/80    Pulse: 85  89    Temp: 37.5 C (99.5 F)  37.2 C (99 F)    Resp: 19  18    SpO2:  96%  93%     Labs:  Recent Labs      06/27/15   2127  06/27/15   2127  06/27/15   2138  06/28/15   0530  06/28/15   0554  06/28/15   0941  06/29/15   0438   WBC   --   14.9*   --    --    --   9.4   --    HGB   --   14.0   --    --    --   11.9*  10.8*   HCT   --   42.2   --    --    --   35.9*  32.7*   SODIUM   --    --   138  136   --    --    --    POTASSIUM   --    --    --   4.7   --    --    --    CHLORIDE   --    --   107  105   --    --    --    BICARBONATE   --    --   24.6   --    --    --    --    BUN  18   --    --   17   --    --    --    CREATININE  1.36*   --    --   1.23   --    --    --    GLUCOSE   --    --   112*   --   Negative   --    --    ANIONGAP   --    --    --   8   --    --    --    CALCIUM   --    --    --   9.2   --    --    --    INR  1.02   --    --    --    --    --    --      In: 966.8 [P.O.:960; I.V.:6.8]  Out: 1805 [Urine:1805]  Nutrition Management: DIET REGULAR  MNT PROTOCOL FOR DIETITIAN Last Bowel Movement: 06/27/15  No results for input(s): ALBUMIN, PREALBUMIN in  the last 72 hours.  Current Medications:  No current outpatient prescriptions on file.     Today's Physical Exam:  GEN:   NAD  HEENT:   Normocephalic; atraumatic.  NECK:   Non-tender to palpation, full ROM without pain or neurologic changes.  PULM:   Lung sounds clear to auscultation  bilaterally.  Normal respiratory effort.    CV:   Regular rate and rhythm.  ABD:   Abdomen soft, nontender, and nondistended.    MS: BLE dressing dry and intact, CMS intact. C/o of bil knee pain.  Distal pulses intact.  Normal strength and range of motion of all other extremities.    NEURO:   Alert and oriented to person, place and time.   Vascular:  All pulses palpable and equal bilaterally.  DP/PT pulses palpable and equal bilaterally.  Integumentary:  Pink, warm, and dry and intact throughout without evidence of injury.       Assessment/ Plan:   Active Hospital Problems   (*Primary Problem)    Diagnosis    *Crush accident     Crushed at thigh level by sand mixer       Fracture of left femur     Mid-shaft  Ortho consult  OR I&D and IM Nail  WBAT      Fracture of right femur     Mid-shaft  Ortho consult  OR IM Nail  WBAT      Trauma     DVT prophylaxis:  Lovenox and SCDs/ Venodynes  Nutrition: DIET REGULAR  MNT PROTOCOL FOR DIETITIAN diet, Last Bowel Movement: 06/27/15  Activity: OOB  Pain: oral meds  Social: lives with family    Plan:   Ortho- bilateral femur fx - OR 7/25- WBAT BLE  Reg deit   OOB with pt  Increase BR no BM since admit  DC PCA this AM and transition to oral pain meds  Ice   dispo pending rehab    Janan Halter, PA 06/29/2015, 08:44      06/29/2015  I saw and examined the patient on 06/29/15.  I reviewed the PA's note.  I agree with the findings and plan of care as documented in the PA's note.  Any exceptions/additions are noted .  Doing fairly well with pain control. Will just DC the PCA. Hemoglobin stable. Referral made to rehabilitation.    Merri Ray, MD 06/29/2015, 16:32

## 2015-06-29 NOTE — OT Treatment (Addendum)
[facility]  Occupational Therapy Progress Note    Patient Name: [name]  Date of Birth: [birth year]  Height:  188 cm ('6\' 2"' )  Weight:  102.059 kg (225 lb)  Room/Bed: 757/A  Payor: WORKER'S COMPENSATION / Plan: OTHER WORK COMP SELF FUNDED / Product Type: Work Comp Editor, commissioning /     Date/Time of Admission: 06/27/2015  9:28 PM  Admitting Diagnosis:  Trauma [T14.90]      Subjective:   States he is doing ok today.  Patient with c/o B LE pain, 2/10 at rest and 6/10 with activity.     Objective:   Precautions: WBAT B LE, fall risk  Patient resting in bed upon entering, wife present. RN approved session. Patient agreeable to OT treatment, seen along with professional assist of PT.  At end of session, patient sitting in bedside chair with all needs met, call button in reach, and wife present. Two pillows placed in chair to elevate height as patient with decreased knee ROM.      Activities of Daily Living (ADL's): Issued long handled adaptive equipment consisting of reacher, sock aid, sponge and shoe horn. Demonstrated and instructed patient on use for LE ADLs secondary to decreased ROM and increased pain in B LE. Patient verbalized understanding and returned demonstration to doff/don sock with reacher and sock aid.   Balance: Static sitting good, dynamic sitting fair+, static standing fair, dynamic standing fair   Functional Transfers: supine to sit minA of 1 and VCs using leg lifter with increased time, sit to stand from elevated EOB with modA of 2 and FWW  Functional Activity: functional mobility bed to chair ~4 feet using FWW and CGassist of 2  Endurance: fair  Therapeutic Exercise: B LE ROM exercises in preparation for functional tasks - see PT note for details.     Assessment:     Patient responded well to Occupational Therapy Treatment session. Patient demonstrating improvements with bed mobility and LE ADLs using AE. Patient continues to demonstrate increased pain  and decreased ROM in B LE. Patient continues to require increased assist for BADLs, functional transfers, and functional mobility. Patient continues to be unsafe for d/c home at this time. Patient is highly motivated to return to fully independent baseline and is expected to progress well with continued therapy in acute rehab upon d/c.     Discharge Needs:   Equipment Recommendations: to be determined  Further Treatment Recommendations: Acute Rehab  JUSTIFICATION OF DISCHARGE RECOMMENDATION  Based on current diagnosis, functional performance prior to admission, and current functional performance, this patient requires continued PT and OT services in [facility] in order to achieve significant functional improvements in these deficit areas: Balance, Endurance, Gait, Safety and Self-Care ADLs and IADLs.    Plan:   Continue to follow according to established plan of care.    The risks/benefits of therapy have been discussed with the patient/caregiver and he/she is in agreement with the established plan of care.     Therapist:   Johann Capers, OT,  06/29/2015  Pager #: [phone]  Treatment Time: 32 minutes  Time may include review of medical chart, obtaining patient's functional history from patient/family/medical staff/case management/ancillary personnel, collaboration on findings and treatment options (with the above mentioned individuals), re-assessment, and acute care rehabilitation.

## 2015-06-29 NOTE — Care Plan (Signed)
Problem: General Plan of Care(Adult,OB)  Goal: Plan of Care Review(Adult,OB)  The patient and/or their representative will communicate an understanding of their plan of care   Outcome: Ongoing (see interventions/notes)  Patient responded well to Occupational Therapy Treatment session. Patient demonstrating improvements with bed mobility and LE ADLs using AE. Patient continues to demonstrate increased pain and decreased ROM in B LE. Patient continues to require increased assist for BADLs, functional transfers, and functional mobility. Patient continues to be unsafe for d/c home at this time. Patient is highly motivated to return to fully independent baseline and is expected to progress well with continued therapy in acute rehab upon d/c.      Discharge Needs:   Equipment Recommendations: to be determined  Further Treatment Recommendations: Acute Rehab  JUSTIFICATION OF DISCHARGE RECOMMENDATION  Based on current diagnosis, functional performance prior to admission, and current functional performance, this patient requires continued PT and [facility] in order to achieve significant functional improvements in these deficit areas: Balance, Endurance, Gait, Safety and Self-Care ADLs and IADLs.      Plan:   Continue to follow according to established plan of care.    The risks/benefits of therapy have been discussed with the patient/caregiver and he/she is in agreement with the established plan of care.   Therapist:   Tanna Furry, OT,  06/29/2015  Pager #: [phone]

## 2015-06-30 MED ORDER — ENOXAPARIN 60 MG/0.6 ML SUBCUTANEOUS SYRINGE
0.50 mg/kg | INJECTION | Freq: Two times a day (BID) | SUBCUTANEOUS | Status: AC
Start: 2015-06-30 — End: 2015-07-30

## 2015-06-30 MED ORDER — METHOCARBAMOL 500 MG TABLET
1000.00 mg | ORAL_TABLET | Freq: Four times a day (QID) | ORAL | Status: DC
Start: 2015-06-30 — End: 2015-11-15

## 2015-06-30 MED ORDER — SENNOSIDES 8.6 MG-DOCUSATE SODIUM 50 MG TABLET
1.00 | ORAL_TABLET | Freq: Two times a day (BID) | ORAL | Status: DC
Start: 2015-06-30 — End: 2015-11-15

## 2015-06-30 MED ORDER — MAGNESIUM CITRATE ORAL SOLUTION
296.00 mL | Freq: Every day | ORAL | Status: DC | PRN
Start: 2015-06-30 — End: 2015-06-30
  Filled 2015-06-30: qty 296

## 2015-06-30 MED ORDER — METHYLNALTREXONE 12 MG/0.6 ML SUBCUTANEOUS SYRINGE
12.0000 mg | INJECTION | Freq: Once | SUBCUTANEOUS | Status: AC
Start: 2015-06-30 — End: 2015-06-30
  Administered 2015-06-30: 12 mg via SUBCUTANEOUS
  Filled 2015-06-30: qty 0.6

## 2015-06-30 MED ORDER — BISACODYL 10 MG RECTAL SUPPOSITORY
10.0000 mg | Freq: Once | RECTAL | Status: AC
Start: 2015-06-30 — End: 2015-06-30
  Administered 2015-06-30: 10 mg via RECTAL
  Filled 2015-06-30: qty 1

## 2015-06-30 MED ORDER — MAGNESIUM CITRATE ORAL SOLUTION
296.00 mL | ORAL | Status: DC
Start: 2015-06-30 — End: 2015-07-01
  Administered 2015-06-30: 0 mL via ORAL

## 2015-06-30 MED ORDER — OXYCODONE-ACETAMINOPHEN 5 MG-325 MG TABLET
2.00 | ORAL_TABLET | ORAL | Status: DC | PRN
Start: 2015-06-30 — End: 2015-11-15

## 2015-06-30 MED ORDER — DOCUSATE SODIUM 100 MG CAPSULE
100.00 mg | ORAL_CAPSULE | Freq: Two times a day (BID) | ORAL | Status: DC
Start: 2015-06-30 — End: 2015-11-15

## 2015-06-30 MED ORDER — LACTULOSE 20 GRAM/30 ML ORAL SOLUTION
30.0000 mL | Freq: Once | ORAL | Status: AC
Start: 2015-06-30 — End: 2015-06-30
  Administered 2015-06-30: 30 mL via ORAL
  Filled 2015-06-30: qty 30

## 2015-06-30 MED ADMIN — sodium chloride 0.9 % (flush) injection syringe: ORAL | @ 21:00:00

## 2015-06-30 MED ADMIN — benzoyl peroxide 5 % topical gel: RECTAL | @ 12:00:00 | NDC 45802091096

## 2015-06-30 MED ADMIN — lidocaine 4 %-menthoL 1 % topical patch: ORAL | @ 05:00:00

## 2015-06-30 MED ADMIN — sodium chloride 0.9 % (flush) injection syringe: ORAL | @ 13:00:00

## 2015-06-30 NOTE — Consults (Addendum)
Ortho Note  06/30/2015  Ortho Service: Sports    S/P bilateral retro femoral nails    Subjective: patient did even more yesterday by getting to the chair.  Overall slowly getting better each day.      Objective:   Filed Vitals:    06/29/15 1546 06/29/15 2100 06/29/15 2335 06/30/15 0100   BP: 126/79  123/76    Pulse: 86  97    Temp: 36.8 C (98.2 F)  37.2 C (99 F)    Resp: 18  16    SpO2:  95%  95%     Lab Results   Component Value Date    WBC 9.4 06/28/2015    HGB 10.8* 06/29/2015    HGB 11.9* 06/28/2015    HGB 14.0 06/27/2015    HCT 32.7* 06/29/2015    PLTCNT 243 06/28/2015    INR 1.02 06/27/2015       BLE    Neuro Intact S/S/SP/DP/PT  Motor intact (dorsi/plantar flex, flex/extend toes)  Dressing changed at bedside today, all incisions c/d/i  2+ DP/PT     Plan:   Abx post op completed   Pain Control   PT/OT: WBAT BLE, encourage passive and active ROM to bilateral knees and hips   DVT Prophylaxis: activity/chemical prophylaxis per primary   Dispo: can discharge home from Ortho standpoint   F/u Dr. Mercy Riding 2 weeks    Burley Saver, [phone]  Resident, PGY-5  [facility]  Burley Saver, MD 06/30/2015, 06:23

## 2015-06-30 NOTE — Progress Notes (Addendum)
[facility]               Trauma Progress Note    Date of Birth:  August 19, 1966  Date of Admission:  06/27/2015  Date of service: 06/30/2015    [name], 49 y.o., male Post trauma day 2 status post crush injury.    Events over the last 24 hours have included:  No acute events overnight  Pain controlled    Subjective:    Patient complains of BLE knee pain.  Patient denies cp, sob,n /v/f.      Objective   24 Hour Summary:    Filed Vitals:    06/29/15 2100 06/29/15 2335 06/30/15 0100 06/30/15 0717   BP:  123/76  117/73   Pulse:  97  90   Temp:  37.2 C (99 F)  37 C (98.6 F)   Resp:  16  17   SpO2: 95%  95%      Labs:  Recent Labs      06/27/15   2127  06/27/15   2127  06/27/15   2138  06/28/15   0530  06/28/15   0554  06/28/15   0941  06/29/15   0438   WBC   --   14.9*   --    --    --   9.4   --    HGB   --   14.0   --    --    --   11.9*  10.8*   HCT   --   42.2   --    --    --   35.9*  32.7*   SODIUM   --    --   138  136   --    --    --    POTASSIUM   --    --    --   4.7   --    --    --    CHLORIDE   --    --   107  105   --    --    --    BICARBONATE   --    --   24.6   --    --    --    --    BUN  18   --    --   17   --    --    --    CREATININE  1.36*   --    --   1.23   --    --    --    GLUCOSE   --    --   112*   --   Negative   --    --    ANIONGAP   --    --    --   8   --    --    --    CALCIUM   --    --    --   9.2   --    --    --    INR  1.02   --    --    --    --    --    --      In: 910 [P.O.:840; I.V.:70]  Out: 2800 [Urine:2800]  Nutrition Management: DIET REGULAR  MNT PROTOCOL FOR DIETITIAN  DIETARY ORAL SUPPLEMENTS Oral Supplements with tray: Ensure High Protein-Chocolate; BREAKFAST/DINNER;  1 Can Last Bowel Movement: 06/27/15  No results for input(s): ALBUMIN, PREALBUMIN in the last 72 hours.  Current Medications:  No current outpatient prescriptions on file.     Today's Physical Exam:  GEN:   NAD  HEENT:   Normocephalic; atraumatic.  NECK:    Non-tender to palpation, full ROM without pain or neurologic changes.  PULM:   Lung sounds clear to auscultation bilaterally.  Normal respiratory effort.    CV:   Regular rate and rhythm.  ABD:   Abdomen soft, nontender, and nondistended.    MS: BLE dressing dry and intact, CMS intact. C/o of bil knee pain.  Distal pulses intact.  Normal strength and range of motion of all other extremities.    NEURO:   Alert and oriented to person, place and time.   Vascular:  All pulses palpable and equal bilaterally.  DP/PT pulses palpable and equal bilaterally.  Integumentary:  Pink, warm, and dry and intact throughout without evidence of injury.       Assessment/ Plan:   Active Hospital Problems   (*Primary Problem)    Diagnosis    *Crush accident     Crushed at thigh level by sand mixer       Fracture of left femur     Mid-shaft  Ortho consult  OR I&D and IM Nail  WBAT      Fracture of right femur     Mid-shaft  Ortho consult  OR IM Nail  WBAT      Trauma     DVT prophylaxis:  Lovenox and SCDs/ Venodynes  Nutrition: DIET REGULAR  MNT PROTOCOL FOR DIETITIAN  DIETARY ORAL SUPPLEMENTS Oral Supplements with tray: Ensure High Protein-Chocolate; BREAKFAST/DINNER; 1 Can diet, Last Bowel Movement: 06/27/15  Activity: OOB  Pain: oral meds  Social: lives with family    Plan:   Ortho- bilateral femur fx - OR 7/25- WBAT BLE  Reg deit   OOB with pt  Increase BR no BM since admit  Oral meds  Ice   dispo pending rehab    Janan Halter, PA 06/30/2015, 08:43      06/30/2015  I saw and examined the patient on 06/30/15.  I reviewed the PA's note.  I agree with the findings and plan of care as documented in the PA's note.  Any exceptions/additions are noted .  Reports pain is controlled. Still no bowel movement. Await rehabilitation placement. Will give relistor.    Merri Ray, MD 06/30/2015, 13:17

## 2015-06-30 NOTE — Discharge Summary (Addendum)
[facility]  DISCHARGE SUMMARY      PATIENT NAME:  [name]  MRN:  [id number]  DOB:  [birth year]    ADMISSION DATE:  06/27/2015  DISCHARGE DATE:  07/01/2015    ATTENDING PHYSICIAN: Merri Ray MD  PRIMARY CARE PHYSICIAN: No primary care provider on file.    ADMISSION DIAGNOSIS: Crush accident  DISCHARGE DIAGNOSIS:   Active Hospital Problems   (*Primary Problem)    Diagnosis    *Crush accident     Crushed at thigh level by sand mixer       Fracture of left femur     Mid-shaft  Ortho consult  OR I&D and IM Nail  WBAT      Fracture of right femur     Mid-shaft  Ortho consult  OR IM Nail  WBAT      Trauma       There are no active non-hospital problems to display for this patient.                                                     DISCHARGE MEDICATIONS:     Current Discharge Medication List      START taking these medications.       Details    docusate sodium 100 mg Capsule   Commonly known as:  COLACE    100 mg, Oral, 2 TIMES DAILY   Qty:  30 Cap   Refills:  0       enoxaparin 60 mg/0.6 mL Syringe   Commonly known as:  LOVENOX    0.5 mg/kg, Subcutaneous, EVERY 12 HOURS   Qty:  30 mL   Refills:  0       methocarbamol 500 mg Tablet   Commonly known as:  ROBAXIN    1,000 mg, Oral, 4 TIMES DAILY   Refills:  0       oxyCODONE-acetaminophen 5-325 mg Tablet   Commonly known as:  PERCOCET    2 Tabs, Oral, EVERY 4 HOURS PRN   Qty:  70 Tab   Refills:  0       sennosides-docusate sodium 8.6-50 mg Tablet   Commonly known as:  SENOKOT-S    1 Tab, Oral, 2 TIMES DAILY   Refills:  0         CONTINUE these medications - NO CHANGES were made during your visit.       Details    DULoxetine 30 mg Capsule, Delayed Release(E.C.)   Commonly known as:  CYMBALTA DR    30 mg, Oral, DAILY   Refills:  0       Ibuprofen 800 mg Tablet   Commonly known as:  MOTRIN    800 mg, Oral, DAILY   Refills:  0       multivitamin Tablet    1 Tab, Oral, DAILY   Refills:  0       simvastatin 40 mg Tablet   Commonly known as:   ZOCOR    40 mg, Oral, EVERY EVENING   Refills:  0             DISCHARGE INSTRUCTIONS:     DISCHARGE INSTRUCTION - RESUME HOME DIET   Diet: RESUME HOME DIET      DISCHARGE INSTRUCTION - ACTIVITY- NO DRIVING WHILE TAKING NARCOTICS   Activity:  NO DRIVING WHILE TAKING NARCOTICS      DISCHARGE INSTRUCTION - TRAUMA   WOUND/INCISION CARE  Wash wounds at least daily with warm, soapy water.  You may do this easily in the shower.  No swimming or submerging the wound, like in a bathtub or hot tub  Pat the wound dry.  DO NOT RUB!  Change dressings as instructed and keep the wound covered when under clothing or if there is a chance that it may become dirty  If you chose to use an antibiotic ointment, (i.e. Bacitracin or Neosporin), be usre to remove all the old ointment before putting on more.  **CALL IF YOU NOTICE:  Drainage that is thick, yellow, or green  Strange or foul smelling odor  Redness, swelling, increased pain, or warmth around the wound    ______________________________      STITCHES/STAPLES  From the day of placement, these should be removed in:  5-7 days on the face  7-10 days on the head/scalp  7-10 days on arms/legs  10-14 days over a knee/elbow/stomach  You may have your family doctor remove these within the above times if returning to the trauma clinic is inconvenient  Please see wound care instructions to know what to return/call for  ______________________________     DISCHARGE INSTRUCTION - MISC     DISCHARGE INSTRUCTION - ACTIVITY   nwb ble   Activity: z - other (specify in comments)      SCHEDULE FOLLOW-UP ORTHO-[facility]   Follow-up in: 2 WEEKS    Reason for visit: HOSPITAL DISCHARGE    Followup reason: s/p bilat femur IMN    Provider: BAL          REASON FOR HOSPITALIZATION AND HOSPITAL COURSE:  The patient is a 49 y.o. male who presented to [facility] ED as a P2 trauma status post Crush accident on 06/27/2015. Report stated "Patient is a Financial controller on a sand mixer and was shoveling sand off  the conveyer belt where it gets stuck near the hydraulic gate when a coworker accidentally opened the hydraulic gate and he lost his balance. He was down on one knee when it happened and he was knocked back. He leaned forward to avoid falling but overcompensated and fell forward resulting in getting lodged and crushed in the machine at thigh level. When extricated, witnesses noted his thighs were grossly deformed. He arrived to [facility] from scene by helicopter. He was GCS 15 and vitally stable. He denies LOC or hitting his head, neck, or back."  The patient had no loss of consciousness and his GCS was 15 on arrival. On arrival to our facility, patient had intact airway, had equal and clear breath sounds bilaterally and was hemodynamically stable. The patient was complaining of BLE pain. Labs were performed and treated appropriately.  On Physical Exam the patient in ED  Initial Vitals:   T 36.8  BP 158/86  HR 84  RR 16  SaO2 99% RA  Exam   GEN: NAD, appears stated age  HEENT: Normocephalic; atraumatic pupils equal, round and reactive to light; extraocular movements are intact. Conjunctivae pink, nasal mucosa normal, mucous membranes moist. No malocclusion.   NECK: C-collar in place  PULM: Lung sounds clear to auscultation bilaterally.  CV: Regular rate and rhythm; S1/S2; no murmur, rub, or gallop.  Chest: External examination non-tender to palpation. and No abrasions or contusions  ABD: Abdomen soft, nontender, and nondistended.  Pelvis: Non-tender to compression /palpation and No evidence  of injury  GU/RECTAL: Normal external inspection. DRE deferred. No gross blood.  Back: No evidence of injury, Non-tender to midline palpation and no step-off  MS: Grossly deformed thighs bilaterally. Extremely tender to movement or palpation Distal pulses intact. Motor and sensation intact BLE. Normal strength and ROM BUE.   NEURO: Alert and oriented to person, place and time. Cranial nerves grossly  intact. GCS 15  Vascular: All pulses palpable and equal bilaterally  Integumentary: Pink, warm, and dry.  The patient had a CXR and PXR in the trauma bay which were negative for trauma. A FAST was performed and was negative for fluid in all 4 windows.  The following additional Imaging was performed:    Radiology:   Internal Studies-  CXR: Negative  XR Pelvis: Negative  FAST: Negative  CT Abdomen and Pelvis w: negat    Incidental findings: As above    The patient was admitted by the Trauma Service to the step down.    Ortho  was consulted for the injuries and pt went to the OR for BLE fixation.  The patient progressed throughout his hospitalization.  Please see the detailed problem list for additional information concerning the patient's hospital course.      The patient was determined safe for discharge on 07/01/15.  Prior to discharge the patient was tolerating a diet, his pain was well controlled on oral pain medications, and he was ambulating well and physical therapy felt he was safe for discharge.  The patient will follow up with Trauma in 2 weeks for reevaluation prn .  The patient will also follow up with Ortho.  All questions were answered prior to discharge and the patient agreed to discharge at this time. The patient was instructed to follow up sooner for new or concerning symptoms.      CONDITION ON DISCHARGE:  A. Ambulation: WBAT BLE  B. Self-care Ability: to rehab  C. Cognitive Status: GCS 15      DISCHARGE DISPOSITION:  Home discharge         Janan Halter, [place] 07/01/2015, 06:49    Merri Ray, MD  07/01/2015, 18:14

## 2015-06-30 NOTE — Care Plan (Signed)
Problem: Perioperative Period (Adult)  Prevent and manage potential problems including:1. bleeding2. gastrointestinal complications3. hypothermia4. infection5. pain6. perioperative injury7. respiratory compromise8. situational response9. urinary retention10. venous thromboembolism11. wound complications   Goal: Signs and Symptoms of Listed Potential Problems Will be Absent or Manageable (Perioperative Period)  Signs and symptoms of listed potential problems will be absent or manageable by discharge/transition of care (reference Perioperative Period (Adult) CPG).   Outcome: Ongoing (see interventions/notes)    Problem: Fall Risk (Adult)  Goal: Identify Related Risk Factors and Signs and Symptoms  Related risk factors and signs and symptoms are identified upon initiation of Human Response Clinical Practice Guideline (CPG)   Outcome: Ongoing (see interventions/notes)  Goal: Absence of Falls  Patient will demonstrate the desired outcomes by discharge/transition of care.   Outcome: Ongoing (see interventions/notes)    Problem: General Plan of Care(Adult,OB)  Goal: Plan of Care Review(Adult,OB)  The patient and/or their representative will communicate an understanding of their plan of care   Outcome: Ongoing (see interventions/notes)  POC reviewed with pt and spouse. Patient up to chair today for 2 hours. Up with 2 assist and walker. Patient had large bowel movement today. Pain controlled with [facility] percocet. Possible D/C to health [facility]. Will continue to monitor.   Goal: Individualization/Patient Specific Goal(Adult/OB)  Outcome: Ongoing (see interventions/notes)

## 2015-06-30 NOTE — PT Treatment (Signed)
Physical Therapy Treatment Note    Subjective:   Pt c/o pain and stiffness.   Pain Assessment: "hurts BAD"/10  Interventions necessary and outcome:nursing present.        Objective:   Pt seen bedside. Pt sitting EOB with nursing attempting to get to [facility]. Nsg unable to transfer pt. Pt with increased pain. Pt transferred sit to stand with mod a x 1 and fww. Pt with increased pain weightbearing. Able to take a few steps to [facility] with fww and mod-max a x 1. Pt stand to sit with max a x 1 due to pain. Pt wanting to sit on bsc for "a while". Pager # given to nsg so that PT can work with pt later today.     Assesment:   Performance was limited by pain and need to use BSC.       Plan:   Continue with functional mobility training and exercises 5 X per week.    Disposition:   Placement Needs:   Acute rehab  .    JUSTIFICATION OF DISCHARGE RECOMMENDATION  Based on current diagnosis, functional performance prior to admission, and current functional performance, this patient requires continued PT services [facility] in order to achieve significant functional improvements in these deficit areas: Endurance, Gait, Safety and Strength.        Therapist :   Treatment Time: 15 minutes  Therapist: Emmit Alexanders , MPT  Pager #: [phone]

## 2015-07-01 ENCOUNTER — Encounter (FREE_STANDING_LABORATORY_FACILITY)
Admit: 2015-07-01 | Disposition: A | Discharge status: Home / Self Care | Payer: Self-pay | Source: Other Acute Inpatient Hospital

## 2015-07-01 ENCOUNTER — Other Ambulatory Visit (FREE_STANDING_LABORATORY_FACILITY): Payer: Self-pay

## 2015-07-01 LAB — CROSSMATCH RED CELLS - UNITS
UNIT DIVISION: 0
UNIT DIVISION: 0
UNIT DIVISION: 0

## 2015-07-01 LAB — TYPE AND SCREEN
ABO/RH(D): A POS
ANTIBODY SCREEN: NEGATIVE
UNITS ORDERED: 4

## 2015-07-01 LAB — HEPARIN LOW MOLECULAR WEIGHT ANTI-XA, PLASMA: HEPARIN LOW MOLECULAR WEIGHT: 0.28 IU/mL

## 2015-07-01 MED ADMIN — electrolyte-A intravenous solution: ORAL | @ 10:00:00 | NDC 00338022104

## 2015-07-01 NOTE — Nurses Notes (Signed)
Reviewed AVS with patient and spouse. Educational handouts were provided regarding care of incisions, activity, rehab, and follow up appointments. Patient and family verbalized understanding of all discharge teaching. Encouraged to ask questions throughout education. IV was removed. Ambulance will transport patient to [facility] today per care management.  Discharged with belongings.

## 2015-07-01 NOTE — Discharge Instructions (Signed)
WOUND/INCISION CARE   Wash wounds at least daily with warm, soapy water.  You may do this easily in the shower.  No swimming or submerging the wound, like in a bathtub or hot tub   Pat the wound dry.  DO NOT RUB!   Change dressings as instructed and keep the wound covered when under clothing or if there is a chance that it may become dirty   If you chose to use an antibiotic ointment, (i.e. Bacitracin or Neosporin), be usre to remove all the old ointment before putting on more.  **CALL IF YOU NOTICE:   Drainage that is thick, yellow, or green   Strange or foul smelling odor   Redness, swelling, increased pain, or warmth around the wound    ______________________________    Discharge Recommendations/ Plan:Discharge NW:GNFAO Rehab Placement/Return (not psych) (code 42) [facility] [address] - [place], [place]    Ambulance transportation has been requested for this afternoon.    Resources: Nursing please call report to [phone]

## 2015-07-01 NOTE — Care Management Notes (Signed)
Referral Information  ++++++ Placed Provider #1 ++++++  Case Manager: Kay Schell  Provider Type: Transportation  Provider Name: [facility] [place]  Address:  [address]  [place], [place] [address]  Contact:    Fax:   Fax:

## 2015-07-01 NOTE — Care Management Notes (Signed)
Referral Information  ++++++ Placed Provider #1 ++++++  Case Manager: Kay Schell  Provider Type: Rehabilitation  Provider Name: [facility]  Address:  [address]  [place], [place] [address]  Contact: Christy Johnson    Phone: [phone] x  Fax:   Fax: [phone]
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
## 2015-07-02 ENCOUNTER — Other Ambulatory Visit (INDEPENDENT_AMBULATORY_CARE_PROVIDER_SITE_OTHER): Payer: Self-pay | Admitting: Physical Medicine & Rehabilitation

## 2015-07-02 ENCOUNTER — Ambulatory Visit
Admission: RE | Admit: 2015-07-02 | Discharge: 2015-07-02 | Disposition: A | Discharge status: Home / Self Care | Payer: Medicaid Other | Source: Ambulatory Visit | Attending: Physical Medicine & Rehabilitation | Admitting: Physical Medicine & Rehabilitation

## 2015-07-02 ENCOUNTER — Ambulatory Visit (HOSPITAL_BASED_OUTPATIENT_CLINIC_OR_DEPARTMENT_OTHER): Payer: Self-pay

## 2015-07-02 DIAGNOSIS — I2699 Other pulmonary embolism without acute cor pulmonale: Secondary | ICD-10-CM

## 2015-07-02 DIAGNOSIS — R0602 Shortness of breath: Secondary | ICD-10-CM | POA: Insufficient documentation

## 2015-07-02 DIAGNOSIS — J9811 Atelectasis: Secondary | ICD-10-CM

## 2015-07-02 LAB — COMPREHENSIVE METABOLIC PANEL, NON-FASTING
ALBUMIN: 2.8 g/dL — ABNORMAL LOW (ref 3.5–5.0)
ALKALINE PHOSPHATASE: 53 U/L (ref <=150)
ALT (SGPT): 35 U/L (ref <=55)
ANION GAP: 5 mmol/L (ref 4–13)
AST (SGOT): 116 U/L — ABNORMAL HIGH (ref 8–48)
BILIRUBIN TOTAL: 1.1 mg/dL (ref 0.3–1.3)
BUN/CREA RATIO: 17 (ref 6–22)
BUN: 18 mg/dL (ref 8–25)
CALCIUM: 9.5 mg/dL (ref 8.5–10.4)
CHLORIDE: 99 mmol/L (ref 96–111)
CO2 TOTAL: 29 mmol/L (ref 22–32)
CREATININE: 1.08 mg/dL (ref 0.62–1.27)
ESTIMATED GFR: >59 mL/min/1.73mˆ2 (ref >59)
GLUCOSE: 108 mg/dL (ref 65–139)
POTASSIUM: 4.4 mmol/L (ref 3.5–5.1)
PROTEIN TOTAL: 6.6 g/dL (ref 6.4–8.3)
SODIUM: 133 mmol/L — ABNORMAL LOW (ref 136–145)

## 2015-07-02 LAB — URINALYSIS, MACROSCOPIC AND MICROSCOPIC W/CULTURE REFLEX - CLIENT CONSOLIDATED
BLOOD: NEGATIVE mg/dL
COLOR: NORMAL
GLUCOSE: NEGATIVE mg/dL
KETONES: NEGATIVE mg/dL
LEUKOCYTES: NEGATIVE WBCs/uL
NITRITE: NEGATIVE
PH: 7 (ref 5.0–8.0)
PROTEIN: NEGATIVE mg/dL
RBCS: 0 /HPF (ref <6.0)
SPECIFIC GRAVITY: 1.023 (ref 1.005–1.030)
UROBILINOGEN: NEGATIVE mg/dL
WBCS: 0 /HPF (ref <4.0)

## 2015-07-02 LAB — CBC WITH DIFF
BASOPHIL #: 0.07 x10ˆ3/uL (ref 0.00–0.20)
BASOPHIL %: 1 %
EOSINOPHIL #: 0.24 x10ˆ3/uL (ref 0.00–0.50)
EOSINOPHIL %: 4 %
HCT: 29.6 % — ABNORMAL LOW (ref 36.7–47.0)
HGB: 9.7 g/dL — ABNORMAL LOW (ref 12.5–16.3)
LYMPHOCYTE #: 1.52 x10ˆ3/uL (ref 1.00–4.80)
LYMPHOCYTE %: 23 %
MCH: 31.4 pg (ref 27.4–33.0)
MCHC: 32.8 g/dL (ref 32.5–35.8)
MCV: 95.6 fL (ref 78.0–100.0)
MONOCYTE #: 0.76 x10ˆ3/uL (ref 0.30–1.00)
MONOCYTE %: 12 %
MPV: 8.6 fL (ref 7.5–11.5)
NEUTROPHIL #: 3.95 x10ˆ3/uL (ref 1.50–7.70)
NEUTROPHIL %: 61 %
PLATELETS: 312 x10ˆ3/uL (ref 140–450)
RBC: 3.09 x10ˆ6/uL — ABNORMAL LOW (ref 4.06–5.63)
RDW: 13.1 % (ref 12.0–15.0)
WBC: 6.5 x10ˆ3/uL (ref 3.5–11.0)

## 2015-07-02 LAB — CBC/DIFF - CLIENT CONSOLIDATED
BASOPHIL #: 0.07 x10ˆ3/uL (ref 0.00–0.20)
BASOPHIL %: 1 %
EOSINOPHIL #: 0.24 x10ˆ3/uL (ref 0.00–0.50)
EOSINOPHIL %: 4 %
HCT: 29.6 % — ABNORMAL LOW (ref 36.7–47.0)
HGB: 9.7 g/dL — ABNORMAL LOW (ref 12.5–16.3)
LYMPHOCYTE #: 1.52 x10ˆ3/uL (ref 1.00–4.80)
LYMPHOCYTE %: 23 %
MCH: 31.4 pg (ref 27.4–33.0)
MCHC: 32.8 g/dL (ref 32.5–35.8)
MCV: 95.6 fL (ref 78.0–100.0)
MCV: 95.6 fL (ref 78.0–100.0)
MONOCYTE #: 0.76 x10ˆ3/uL (ref 0.30–1.00)
MONOCYTE %: 12 %
MPV: 8.6 fL (ref 7.5–11.5)
NEUTROPHIL #: 3.95 x10ˆ3/uL (ref 1.50–7.70)
NEUTROPHIL %: 61 %
PLATELETS: 312 x10ˆ3/uL (ref 140–450)
RBC: 3.09 x10ˆ6/uL — ABNORMAL LOW (ref 4.06–5.63)
RDW: 13.1 % (ref 12.0–15.0)
WBC: 6.5 10*3/uL (ref 3.5–11.0)
WBC: 6.5 x10ˆ3/uL
WBC: 6.5 x10ˆ3/uL (ref 3.5–11.0)

## 2015-07-02 LAB — RETICULOCYTE COUNT
IMMATURE RETIC FRACTION: 0.68 — ABNORMAL HIGH (ref 0.29–0.53)
MEAN RETIC VOLUME: 129.7 fl — ABNORMAL HIGH (ref 97.4–120.2)
RETICULOCYTE % AUTOMATED: 3.16 % — ABNORMAL HIGH (ref 0.5–2)
RETICULOCYTES COUNT # AUTOMATED: 97.6 x10(3)/uL — ABNORMAL HIGH (ref 22.1–96.3)

## 2015-07-02 LAB — URINALYSIS, MICROSCOPIC
RBCS: 0 /HPF (ref <6.0)
WBCS: 0 /HPF (ref <4.0)

## 2015-07-02 LAB — URINALYSIS, MACROSCOPIC
BLOOD: NEGATIVE mg/dL
COLOR: NORMAL
GLUCOSE: NEGATIVE mg/dL
KETONES: NEGATIVE mg/dL
LEUKOCYTES: NEGATIVE WBCs/uL
NITRITE: NEGATIVE
PH: 7 (ref 5.0–8.0)
PROTEIN: NEGATIVE mg/dL
SPECIFIC GRAVITY: 1.023 (ref 1.005–1.030)
UROBILINOGEN: NEGATIVE mg/dL

## 2015-07-02 LAB — IRON: IRON: 55 ug/dL (ref 55–175)

## 2015-07-02 LAB — SEDIMENTATION RATE: ERYTHROCYTE SEDIMENTATION RATE (ESR): 69 mm/h — ABNORMAL HIGH (ref 0–15)

## 2015-07-02 LAB — PREALBUMIN: PREALBUMIN: 15.2 mg/dL — ABNORMAL LOW (ref 18.0–45.0)

## 2015-07-02 LAB — MAGNESIUM: MAGNESIUM: 2 mg/dL (ref 1.6–2.5)

## 2015-07-02 LAB — C-REACTIVE PROTEIN(CRP),INFLAMMATION: CRP INFLAMMATION: 92.2 mg/L — ABNORMAL HIGH (ref <=8.0)

## 2015-07-02 MED ORDER — IOPAMIDOL 370 MG IODINE/ML (76 %) INTRAVENOUS SOLUTION
80.00 mL | INTRAVENOUS | Status: DC
Start: 2015-07-02 — End: 2015-07-03

## 2015-07-02 NOTE — Telephone Encounter (Signed)
LM recommending patient continue to wear stockings if able to tolerate as an added benefit with the Lovenox therapy.  Darryl Nestle  RN  07/02/2015 13:47

## 2015-07-02 NOTE — Telephone Encounter (Signed)
-----   Message from Mignon Pine sent at 07/02/2015 12:09 PM EDT -----  >> ANNIE BOYD 07/02/2015 12:09 PM  BAL Pt    HD 7.25.16/ s/p bilat femur IMN    Hello,  Pt's wife wants to know if pt should still be wearing his compression stockings, she states he is not currently wearing them. Please advise.  Thanks,  Jerilynn Mages

## 2015-07-04 ENCOUNTER — Other Ambulatory Visit (FREE_STANDING_LABORATORY_FACILITY): Payer: Self-pay

## 2015-07-05 LAB — CBC
HCT: 27.4 % — ABNORMAL LOW (ref 36.7–47.0)
HGB: 9.1 g/dL — ABNORMAL LOW (ref 12.5–16.3)
MCH: 32.1 pg (ref 27.4–33.0)
MCHC: 33.3 g/dL (ref 32.5–35.8)
MCV: 96.5 fL (ref 78.0–100.0)
MPV: 8 fL (ref 7.5–11.5)
PLATELETS: 398 x10ˆ3/uL (ref 140–450)
RBC: 2.84 x10ˆ6/uL — ABNORMAL LOW (ref 4.06–5.63)
RDW: 13.7 % (ref 12.0–15.0)
WBC: 7.5 x10ˆ3/uL (ref 3.5–11.0)

## 2015-07-05 LAB — BASIC METABOLIC PANEL
ANION GAP: 8 mmol/L (ref 4–13)
BUN/CREA RATIO: 20 (ref 6–22)
BUN: 20 mg/dL (ref 8–25)
CALCIUM: 9.6 mg/dL (ref 8.5–10.4)
CHLORIDE: 101 mmol/L (ref 96–111)
CO2 TOTAL: 25 mmol/L (ref 22–32)
CREATININE: 1.01 mg/dL (ref 0.62–1.27)
ESTIMATED GFR: >59 mL/min/1.73mˆ2 (ref >59)
GLUCOSE: 96 mg/dL (ref 65–139)
POTASSIUM: 4.1 mmol/L (ref 3.5–5.1)
SODIUM: 134 mmol/L — ABNORMAL LOW (ref 136–145)

## 2015-07-05 LAB — PT/INR
INR: 1.01 (ref 0.80–1.20)
PROTHROMBIN TIME: 11.3 s (ref 9.0–13.4)

## 2015-07-07 ENCOUNTER — Other Ambulatory Visit (FREE_STANDING_LABORATORY_FACILITY): Payer: Self-pay

## 2015-07-07 LAB — HEPATIC FUNCTION PANEL
ALBUMIN: 2.9 g/dL — ABNORMAL LOW (ref 3.5–5.0)
ALKALINE PHOSPHATASE: 64 U/L (ref <=150)
ALT (SGPT): 43 U/L (ref <=55)
AST (SGOT): 29 U/L (ref 8–48)
BILIRUBIN DIRECT: 0.4 mg/dL — ABNORMAL HIGH (ref <=0.3)
BILIRUBIN TOTAL: 1 mg/dL (ref 0.3–1.3)
PROTEIN TOTAL: 6.5 g/dL (ref 6.4–8.3)
PROTEIN TOTAL: 6.5 g/dL (ref 6.4–8.3)

## 2015-07-07 LAB — H & H
HCT: 29 % — ABNORMAL LOW (ref 36.7–47.0)
HGB: 9.4 g/dL — ABNORMAL LOW (ref 12.5–16.3)

## 2015-07-07 LAB — SEDIMENTATION RATE: ERYTHROCYTE SEDIMENTATION RATE (ESR): 78 mm/h — ABNORMAL HIGH (ref 0–15)

## 2015-07-07 LAB — C-REACTIVE PROTEIN(CRP),INFLAMMATION: CRP INFLAMMATION: 35.1 mg/L — ABNORMAL HIGH (ref <=8.0)

## 2015-07-12 ENCOUNTER — Other Ambulatory Visit (FREE_STANDING_LABORATORY_FACILITY): Payer: Self-pay | Admitting: Physical Medicine & Rehabilitation

## 2015-07-12 LAB — COMPREHENSIVE METABOLIC PANEL, NON-FASTING
ALBUMIN: 3.1 g/dL — ABNORMAL LOW (ref 3.5–5.0)
ALKALINE PHOSPHATASE: 102 U/L (ref <150)
ALT (SGPT): 26 U/L (ref <55)
ANION GAP: 6 mmol/L (ref 4–13)
AST (SGOT): 18 U/L (ref 8–48)
BILIRUBIN TOTAL: 0.6 mg/dL (ref 0.3–1.3)
BUN/CREA RATIO: 14 (ref 6–22)
BUN: 15 mg/dL (ref 8–25)
CALCIUM: 9.8 mg/dL (ref 8.5–10.4)
CALCIUM: 9.8 mg/dL (ref 8.5–10.4)
CHLORIDE: 104 mmol/L (ref 96–111)
CO2 TOTAL: 27 mmol/L (ref 22–32)
CREATININE: 1.06 mg/dL (ref 0.62–1.27)
ESTIMATED GFR: >59 mL/min/1.73mˆ2 (ref >59)
GLUCOSE: 92 mg/dL (ref 65–139)
POTASSIUM: 4.3 mmol/L (ref 3.5–5.1)
PROTEIN TOTAL: 6.6 g/dL (ref 6.4–8.3)
SODIUM: 137 mmol/L (ref 136–145)

## 2015-07-12 LAB — CBC
HCT: 30.2 % — ABNORMAL LOW (ref 36.7–47.0)
HGB: 10 g/dL — ABNORMAL LOW (ref 12.5–16.3)
MCH: 31.9 pg (ref 27.4–33.0)
MCHC: 33 g/dL (ref 32.5–35.8)
MCV: 96.7 fL (ref 78.0–100.0)
MPV: 7.8 fL (ref 7.5–11.5)
PLATELETS: 702 x10ˆ3/uL — ABNORMAL HIGH (ref 140–450)
RBC: 3.13 x10ˆ6/uL — ABNORMAL LOW (ref 4.06–5.63)
RDW: 14.2 % (ref 12.0–15.0)
WBC: 5.9 x10ˆ3/uL (ref 3.5–11.0)

## 2015-07-12 LAB — PT/INR
INR: 3.19 — ABNORMAL HIGH (ref 0.80–1.20)
PROTHROMBIN TIME: 35.3 s — ABNORMAL HIGH (ref 9.0–13.4)

## 2015-07-14 ENCOUNTER — Other Ambulatory Visit (FREE_STANDING_LABORATORY_FACILITY): Payer: Self-pay | Admitting: Physical Medicine & Rehabilitation

## 2015-07-15 ENCOUNTER — Ambulatory Visit (HOSPITAL_BASED_OUTPATIENT_CLINIC_OR_DEPARTMENT_OTHER): Payer: No Typology Code available for payment source | Admitting: ORTHOPEDIC, SPORTS MEDICINE

## 2015-07-15 ENCOUNTER — Other Ambulatory Visit (FREE_STANDING_LABORATORY_FACILITY): Payer: Self-pay | Admitting: Physical Medicine & Rehabilitation

## 2015-07-15 ENCOUNTER — Ambulatory Visit
Admission: RE | Admit: 2015-07-15 | Discharge: 2015-07-15 | Disposition: A | Discharge status: Home / Self Care | Payer: No Typology Code available for payment source | Source: Ambulatory Visit | Attending: ORTHOPEDIC, SPORTS MEDICINE | Admitting: ORTHOPEDIC, SPORTS MEDICINE

## 2015-07-15 ENCOUNTER — Ambulatory Visit (INDEPENDENT_AMBULATORY_CARE_PROVIDER_SITE_OTHER): Payer: Self-pay | Admitting: Physician Assistant

## 2015-07-15 ENCOUNTER — Encounter (HOSPITAL_BASED_OUTPATIENT_CLINIC_OR_DEPARTMENT_OTHER): Payer: Self-pay | Admitting: ORTHOPEDIC, SPORTS MEDICINE

## 2015-07-15 DIAGNOSIS — I2699 Other pulmonary embolism without acute cor pulmonale: Secondary | ICD-10-CM

## 2015-07-15 DIAGNOSIS — S7292XA Unspecified fracture of left femur, initial encounter for closed fracture: Secondary | ICD-10-CM

## 2015-07-15 DIAGNOSIS — S7291XA Unspecified fracture of right femur, initial encounter for closed fracture: Secondary | ICD-10-CM

## 2015-07-15 DIAGNOSIS — S7292XE Unspecified fracture of left femur, subsequent encounter for open fracture type I or II with routine healing: Secondary | ICD-10-CM | POA: Insufficient documentation

## 2015-07-15 DIAGNOSIS — Z7901 Long term (current) use of anticoagulants: Secondary | ICD-10-CM | POA: Insufficient documentation

## 2015-07-15 DIAGNOSIS — S7291XD Unspecified fracture of right femur, subsequent encounter for closed fracture with routine healing: Secondary | ICD-10-CM | POA: Insufficient documentation

## 2015-07-15 HISTORY — DX: Other pulmonary embolism without acute cor pulmonale (CMS HCC): I26.99

## 2015-07-15 LAB — CBC/DIFF - CLIENT CONSOLIDATED
BASOPHIL #: 0.08 x10ˆ3/uL (ref 0.00–0.20)
BASOPHIL %: 2 %
BASOPHIL %: 2 %
EOSINOPHIL #: 0.2 x10ˆ3/uL (ref 0.00–0.50)
EOSINOPHIL %: 5 %
HCT: 30.8 % — ABNORMAL LOW (ref 36.7–47.0)
HGB: 10.1 g/dL — ABNORMAL LOW (ref 12.5–16.3)
LYMPHOCYTE #: 1.69 x10ˆ3/uL (ref 1.00–4.80)
LYMPHOCYTE %: 40 %
MCH: 31.6 pg (ref 27.4–33.0)
MCHC: 32.8 g/dL (ref 32.5–35.8)
MCV: 96.4 fL (ref 78.0–100.0)
MONOCYTE #: 0.54 x10ˆ3/uL (ref 0.30–1.00)
MONOCYTE %: 13 %
MPV: 7.6 fL (ref 7.5–11.5)
NEUTROPHIL #: 1.76 x10ˆ3/uL (ref 1.50–7.70)
NEUTROPHIL %: 41 %
PLATELETS: 738 x10ˆ3/uL — ABNORMAL HIGH (ref 140–450)
RBC: 3.19 x10ˆ6/uL — ABNORMAL LOW (ref 4.06–5.63)
RDW: 14.5 % (ref 12.0–15.0)
WBC: 4.3 x10ˆ3/uL
WBC: 4.3 x10ˆ3/uL (ref 3.5–11.0)

## 2015-07-15 LAB — URINALYSIS, MACROSCOPIC
BILIRUBIN: NEGATIVE mg/dL
BLOOD: NEGATIVE mg/dL
COLOR: NORMAL
GLUCOSE: NEGATIVE mg/dL
KETONES: NEGATIVE mg/dL
LEUKOCYTES: NEGATIVE WBCs/uL
NITRITE: NEGATIVE
PH: 5 (ref 5.0–8.0)
PROTEIN: NEGATIVE mg/dL
SPECIFIC GRAVITY: 1.013 (ref 1.005–1.030)
UROBILINOGEN: NEGATIVE mg/dL

## 2015-07-15 LAB — CBC WITH DIFF
BASOPHIL #: 0.08 x10ˆ3/uL (ref 0.00–0.20)
BASOPHIL %: 2 %
EOSINOPHIL #: 0.2 x10ˆ3/uL (ref 0.00–0.50)
EOSINOPHIL %: 5 %
HCT: 30.8 % — ABNORMAL LOW (ref 36.7–47.0)
HGB: 10.1 g/dL — ABNORMAL LOW (ref 12.5–16.3)
LYMPHOCYTE #: 1.69 x10ˆ3/uL (ref 1.00–4.80)
LYMPHOCYTE %: 40 %
MCH: 31.6 pg (ref 27.4–33.0)
MCHC: 32.8 g/dL (ref 32.5–35.8)
MCV: 96.4 fL (ref 78.0–100.0)
MONOCYTE #: 0.54 x10ˆ3/uL (ref 0.30–1.00)
MONOCYTE %: 13 %
MPV: 7.6 fL (ref 7.5–11.5)
NEUTROPHIL #: 1.76 x10ˆ3/uL (ref 1.50–7.70)
NEUTROPHIL %: 41 %
PLATELETS: 738 x10ˆ3/uL — ABNORMAL HIGH (ref 140–450)
RBC: 3.19 x10ˆ6/uL — ABNORMAL LOW (ref 4.06–5.63)
RDW: 14.5 % (ref 12.0–15.0)
WBC: 4.3 10*3/uL (ref 3.5–11.0)
WBC: 4.3 x10ˆ3/uL (ref 3.5–11.0)

## 2015-07-15 LAB — URINALYSIS, MACROSCOPIC AND MICROSCOPIC W/CULTURE REFLEX - CLIENT CONSOLIDATED
BILIRUBIN: NEGATIVE mg/dL
BLOOD: NEGATIVE mg/dL
COLOR: NORMAL
GLUCOSE: NEGATIVE mg/dL
KETONES: NEGATIVE mg/dL
LEUKOCYTES: NEGATIVE WBCs/uL
NITRITE: NEGATIVE
PH: 5 (ref 5.0–8.0)
PROTEIN: NEGATIVE mg/dL
RBCS: 0 /HPF (ref <6.0)
SPECIFIC GRAVITY: 1.013 (ref 1.005–1.030)
UROBILINOGEN: NEGATIVE mg/dL
WBCS: <1 /HPF (ref <4.0)

## 2015-07-15 LAB — URINALYSIS, MICROSCOPIC
RBCS: 0 /HPF (ref <6.0)
WBCS: <1 /HPF (ref <4.0)

## 2015-07-15 NOTE — Telephone Encounter (Signed)
spoek with pts family. Pt to follow with trauma prn. He definatelye needed to see Dr Mercy Riding which she notes he will see today. Pt insrutrcted Dr Mercy Riding managing his care due to isolated injuires but may call trauma with any futher questions or concerns.   Janan Halter, [place]  07/15/2015, 15:26
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
## 2015-07-16 NOTE — Progress Notes (Addendum)
[facility]                              DEPARTMENT OF [place], [place] [address]                                PATIENT NAME: [name], [name] [facility] [place] [id number]  DATE OF SERVICE:07/15/2015  DATE OF BIRTH: March 11, 1966    PROGRESS NOTE    CLAIM NUMBER:  [id number]  DATE OF INJURY:  June 27, 2015.    SUBJECTIVE:  Mr. Mohammad is a 49 year old male who presented to clinic today, now approximately a little over 2 weeks out from an IM nail of bilateral femur fractures, carried out on June 29, 2015.  He has been staying at [employer] [employer].  He has been ambulating with crutches and weightbearing as he tolerates.  He has been wearing TED hose.  He also complains of having some fevers in the evening from about 7:30 p.m. on and then it goes away overnight.  He had some lab work performed yesterday regarding that.  He was also diagnosed with pulmonary embolism while at [facility].  He is on Coumadin now being treated for that.    SOCIAL HISTORY:  He has been staying at [employer].  This is also a work-related injury.  He has been off of work since June 27, 2015.    OBJECTIVE:  On physical exam today, skin incisions were healing well, no signs of erythema or any infection.  He was neurovascularly intact distally.    IMAGING:  Radiographs taken today of the femurs demonstrate everything to be in appropriate alignment.    ASSESSMENT:    1.  Status post intramedullary nail, left open femur fracture.  2.  Status post intramedullary nail, right closed femur fracture.  3.  Postoperative pulmonary embolism.    PLAN:  We will have Mr. Hillhouse continue at [employer].  Once they feel like he is able to go home, we would permit him to do so.  We gave him an outpatient physical therapy prescription for when he is discharged to work on gait training, range of motion of his knees and hips.  He can do stationary bike.  We gave him an off-work slip  until October 05, 2015.  We will see him back in 4 weeks with x-rays of both femurs and he will continue his Coumadin treatment for his pulmonary embolism.  We filled out some [employer] paperwork on him today about his report of work ability.      Theodore Demark  [facility]opaed[facility]    Maggie Font, MD  Assistant Professor  [facility] Department of Orthopaedics    GE/[id number]; D: 07/15/2015 14:06:55; T: 07/16/2015 10:07:04    cc: [employer]       [id number]       [place], [place] [address]

## 2015-07-19 ENCOUNTER — Telehealth (HOSPITAL_BASED_OUTPATIENT_CLINIC_OR_DEPARTMENT_OTHER): Payer: Self-pay | Admitting: Physician Assistant

## 2015-07-19 LAB — ADULT ROUTINE BLOOD CULTURE, SET OF 2 BOTTLES (BACTERIA AND YEAST)
BLOOD CULTURE, ROUTINE: NO GROWTH
BLOOD CULTURE, ROUTINE: NO GROWTH
BLOOD CULTURE, ROUTINE: NO GROWTH

## 2015-07-19 MED ORDER — OXYCODONE 5 MG TABLET
5.00 mg | ORAL_TABLET | ORAL | Status: DC | PRN
Start: 2015-07-19 — End: 2015-11-15

## 2015-07-19 NOTE — Telephone Encounter (Signed)
A script for oxycodone 5 mg #80 1 po q 4hours max of 6 tablets per day was mailed to home address.  Dos:  06/28/15  IM  Nail bilateral femur     >> Oren Bracket 07/19/2015 09:31 AM  Bal Pt    Hello  Patients wife called and stated" Dr Mercy Riding advised [place] that if we ran out of pain meds to call in and he would mail them out". I did advised that that " Our Drs typically do not refill perscriptions that they did not write". Please mail to patient.       They are requesting:    OxyContin 10 Mg- 1 tab twice day   Oxycodone HCL 5 Mg- 2 every 6 hours    Thanks  AmandaW

## 2015-07-22 ENCOUNTER — Ambulatory Visit (HOSPITAL_BASED_OUTPATIENT_CLINIC_OR_DEPARTMENT_OTHER): Payer: Self-pay

## 2015-07-22 NOTE — Telephone Encounter (Signed)
Form completed. Waiting for Dr. Mercy Riding to sign.  Darryl Nestle  RN  07/22/2015 14:06

## 2015-07-22 NOTE — Telephone Encounter (Signed)
-----   Message from Risa Grill sent at 07/20/2015 12:45 PM EDT -----  >> Risa Grill 07/20/2015 12:45 PM  Bal pt    woc is asking if you will send a prior auth for pain meds and blood thinner to get a prior auth. She did not know the names of them.    She said 3rd party [employer] is handling the woc medication auths.    Fax: [phone]    She is faxing you a form.    Thank you.

## 2015-07-22 NOTE — Telephone Encounter (Signed)
Informed wife that Dr. Mercy Riding did not want to refill OxyContin. Instructed to finish remaining medication and we would refill the oxycodone as needed.  Darryl Nestle  RN  07/22/2015 12:39

## 2015-07-22 NOTE — Telephone Encounter (Signed)
-----   Message from [facility] sent at 07/20/2015 11:03 AM EDT -----  Regarding: pain med request  >> CASSIE TERLOSKY 07/20/2015 11:03 AM  Bal pt   Pt's wife is calling wanting to know if the OxyContin 10 Mg- 1 tab twice day can be mailed out also and if it is not going to be filled they would like a call to go over as to why   Thanks  Cassie

## 2015-08-16 ENCOUNTER — Ambulatory Visit
Admission: RE | Admit: 2015-08-16 | Discharge: 2015-08-16 | Disposition: A | Discharge status: Home / Self Care | Payer: No Typology Code available for payment source | Source: Ambulatory Visit | Attending: ORTHOPEDIC, SPORTS MEDICINE | Admitting: ORTHOPEDIC, SPORTS MEDICINE

## 2015-08-16 ENCOUNTER — Encounter (HOSPITAL_BASED_OUTPATIENT_CLINIC_OR_DEPARTMENT_OTHER): Payer: Self-pay | Admitting: ORTHOPEDIC, SPORTS MEDICINE

## 2015-08-16 ENCOUNTER — Ambulatory Visit (HOSPITAL_BASED_OUTPATIENT_CLINIC_OR_DEPARTMENT_OTHER): Payer: No Typology Code available for payment source | Admitting: ORTHOPEDIC, SPORTS MEDICINE

## 2015-08-16 VITALS — BP 139/88 | HR 90 | Temp 97.5°F

## 2015-08-16 DIAGNOSIS — Z981 Arthrodesis status: Secondary | ICD-10-CM | POA: Insufficient documentation

## 2015-08-16 DIAGNOSIS — S7291XA Unspecified fracture of right femur, initial encounter for closed fracture: Secondary | ICD-10-CM

## 2015-08-16 DIAGNOSIS — S7292XA Unspecified fracture of left femur, initial encounter for closed fracture: Secondary | ICD-10-CM

## 2015-08-16 DIAGNOSIS — S7291XD Unspecified fracture of right femur, subsequent encounter for closed fracture with routine healing: Secondary | ICD-10-CM | POA: Insufficient documentation

## 2015-08-16 DIAGNOSIS — Z9889 Other specified postprocedural states: Secondary | ICD-10-CM | POA: Insufficient documentation

## 2015-08-16 DIAGNOSIS — S7292XD Unspecified fracture of left femur, subsequent encounter for closed fracture with routine healing: Secondary | ICD-10-CM | POA: Insufficient documentation

## 2015-08-16 MED ORDER — METHOCARBAMOL 500 MG TABLET
500.00 mg | ORAL_TABLET | Freq: Three times a day (TID) | ORAL | Status: DC
Start: 2015-08-16 — End: 2015-11-15

## 2015-08-16 MED ORDER — HYDROCODONE 5 MG-ACETAMINOPHEN 325 MG TABLET
1.00 | ORAL_TABLET | ORAL | Status: DC | PRN
Start: 2015-08-16 — End: 2015-11-15

## 2015-08-17 NOTE — Progress Notes (Addendum)
[facility]                              DEPARTMENT OF [place], [place] [address]                                PATIENT NAME: [name], [name] [facility] UEAVWU:[id number]  DATE OF SERVICE:08/16/2015  DATE OF BIRTH: December 25, 1965    PROGRESS NOTE    DATE OF INJURY:  June 27, 2015.  CLAIM #: [id number].    SUBJECTIVE:  [name] is a 49 year old male who presents to clinic approximately 7 weeks after placement of bilateral IM nails for an open left-sided femur fracture and a closed right-sided femur fracture which occurred on June 29, 2015.  Since his last appointment, he has been discharged from [facility] and has begun going to outpatient physical therapy.  He goes approximately 3 times a week.  He says that his progress has been slower since he left his inpatient rehab.  He is also experiencing a significant amount of pain.  He continues to take oxycodone, alternating with Tylenol, several times a day for his pain.  He is unable to take anti-inflammatory medications because of the Coumadin he is taking for his postoperative PE.  The patient has good range of motion at the knee and hip.  He has 5/5 strength in the quads.  His incisions are healing well, although there is moderate hypertrophy at the distal incisions and he is sensitive at those areas to light touch.  The patient also reports moderate numbness and occasional paresthesias in the L4 nerve distributions on his right lower extremity; however, he does not have any foot drop or signs of weakness. The patient says that this area of numbness is improving with time.  He is also concerned that he palpates what he believes to be the distal locking screws on the lateral aspect of both of his knees.  He says this is nonpainful to light touch, but he was concerned that this might mean that the hardware was loosening.  Other than these concerns, the patient has been doing  well.    OBJECTIVE:  [name] is a pleasant male, in no acute distress.  Examination of his bilateral lower extremities show well-healing incisions with no obvious erythema.  He has full range of motion of bilateral hips and bilateral knees.  Strength of his hip abductors is 4/5 and 4+/5 in his quads.  He is neurovascularly intact distally with the only exception being some numbness in the L4 nerve distribution on the right side.  No foot drop is appreciated.      IMAGING:  New x-rays of bilateral lower femurs were obtained at today's appointment and reviewed with the patient and Dr. Mercy Riding.  They show good interval healing of bilateral femur fractures with intact hardware in good placement.    ASSESSMENT:  [name] is approximately 7 weeks status post placement of bilateral IM nails.    PLAN:  At this point, the patient is progressing well with his physical therapy exercises.  We provided him with a new physical therapy perscription to focus on quad strengthening. We will allow him to progress as  tolerated with no weight limit.  We will also provide him with a prescription for forearm crutches which will aid in his transition to a cane and then to walking unassisted.  We gave the patient a new rx for hydrocodone which he can take on an as needed basis for pain; however, we encouraged him to wean off this medication and transition to extra strength Tylenol.  He was also provided with a prescription for a muscle relaxant, which he can take as needed, specifically during his physical therapy exercises.       We discussed with the patient that it is likely scar tissue that he is feeling on the lateral aspect of the knee rather than the actual hardware, as review of x-rays obtained from today's appointment show the distal screws to be in good placement. We also discussed that his area of numbness in the L4 distribution on the right will likely resolve with time and it may take up to a year and a half to have full  recovery from this injury.  We will provide the patient with a work excuse for the next 8 weeks and we will see him back in 6 weeks for his 52-month followup appointment.  He is welcome to call before that time if he has any questions or concerns.      Jennette Dubin, MD  Resident  [place] Department of Orthopaedics    Maggie Font, MD  Assistant Professor  [facility] Department of Orthopaedics    QM/[id number]; D: 08/16/2015 12:06:11; T: 08/17/2015 06:18:50

## 2015-08-25 ENCOUNTER — Ambulatory Visit (HOSPITAL_BASED_OUTPATIENT_CLINIC_OR_DEPARTMENT_OTHER): Payer: Self-pay

## 2015-08-25 NOTE — Telephone Encounter (Signed)
Patient informed that forms have been filled out and will be faxed 9/22 after Dr. Mercy Riding signs them.  Darryl Nestle  RN  08/25/2015 13:14

## 2015-08-25 NOTE — Telephone Encounter (Signed)
-----   Message from [employer] sent at 08/19/2015  1:43 PM EDT -----  Regarding: DOCUMENTS NEEDED  >> DARLENE SUMMERS 08/19/2015 01:43 PM  [facility] no longer needs the C-9 form for crutches.      They do however need:  Note from 08/16/15, C-9 for additional PT and an updated MEDCO 14 (work restrictions or abilities).    Fax: [phone]  CLM: [phone]    Thanks,  Darlene    >> STEPHANIE RICE 08/17/2015 10:53 AM  She is needing a C-9 form for forearm crutches.  Please fax [phone].    Thanks  [employer]

## 2015-09-30 ENCOUNTER — Ambulatory Visit (HOSPITAL_BASED_OUTPATIENT_CLINIC_OR_DEPARTMENT_OTHER): Payer: No Typology Code available for payment source | Admitting: ORTHOPEDIC, SPORTS MEDICINE

## 2015-09-30 ENCOUNTER — Ambulatory Visit
Admission: RE | Admit: 2015-09-30 | Discharge: 2015-09-30 | Disposition: A | Discharge status: Home / Self Care | Payer: No Typology Code available for payment source | Source: Ambulatory Visit | Attending: ORTHOPEDIC, SPORTS MEDICINE | Admitting: ORTHOPEDIC, SPORTS MEDICINE

## 2015-09-30 VITALS — BP 142/99 | HR 105 | Temp 98.4°F

## 2015-09-30 DIAGNOSIS — S7292XD Unspecified fracture of left femur, subsequent encounter for closed fracture with routine healing: Secondary | ICD-10-CM | POA: Insufficient documentation

## 2015-09-30 DIAGNOSIS — S7292XA Unspecified fracture of left femur, initial encounter for closed fracture: Secondary | ICD-10-CM

## 2015-09-30 DIAGNOSIS — Z87891 Personal history of nicotine dependence: Secondary | ICD-10-CM | POA: Insufficient documentation

## 2015-09-30 DIAGNOSIS — S7291XA Unspecified fracture of right femur, initial encounter for closed fracture: Secondary | ICD-10-CM

## 2015-09-30 DIAGNOSIS — S79101D Unspecified physeal fracture of lower end of right femur, subsequent encounter for fracture with routine healing: Secondary | ICD-10-CM

## 2015-09-30 DIAGNOSIS — S79102D Unspecified physeal fracture of lower end of left femur, subsequent encounter for fracture with routine healing: Secondary | ICD-10-CM

## 2015-09-30 DIAGNOSIS — S7291XD Unspecified fracture of right femur, subsequent encounter for closed fracture with routine healing: Secondary | ICD-10-CM | POA: Insufficient documentation

## 2015-09-30 MED ORDER — METHOCARBAMOL 500 MG TABLET
500.00 mg | ORAL_TABLET | Freq: Three times a day (TID) | ORAL | Status: DC
Start: 2015-09-30 — End: 2016-05-22

## 2015-10-01 NOTE — Progress Notes (Addendum)
[facility]                              [place]                                [place], [place] [id number]                                PATIENT NAME: [name] PlaterWALLACE, [facility] ZHYQMV:[id number]:[id number]  DATE OF SERVICE:09/30/2015  DATE OF BIRTH: 1966-09-11    PROGRESS NOTE    WORKERS' COMPENSATION CLAIM #:  [id number].  GROUP #:  [id number].  DATE OF SURGERY:  June 28, 2015, bilateral femur intramedullary nailing.    SUBJECTIVE:  This is a pleasant 49 year old gentleman approximately 3 months out from the above-stated procedure.  He states that his pain in both of his legs has been gradually improving since the last time he was seen in clinic.  He has been in formalized physical therapy working on strengthening of both of his legs.  He does note some increased pain in his right knee over the last couple of weeks.  He locates this just inferior to the patella.  He denies any popping, clicking, catching or locking.  However, he does say that sometimes it gives out on him when he is trying to do his physical therapy.  He did not have any prior injuries or surgeries to that knee.  The patient denies any numbness or tingling in his bilateral toes.  He was previously using a cane to help him walk, but he has stopped that about 1 week ago.    REVIEW OF SYSTEMS:  Outside of bilateral femurs as noted above, all other systems were reviewed and are negative.    SOCIAL HISTORY:  The patient was employed as an Arboriculturistequipment operator.  He has been off work since the injury.  He is a former smoker, but he has quit since his accident.    OBJECTIVE:  This is a well-appearing 49 year old male in no distress at this visit.  Vital signs:  Blood pressure 142/99, pulse 105, temperature 36.9 degrees Celsius.  On examination of the left lower extremity, his incisions have healed well without signs of infection.  He is neurovascularly intact.  The patient has full range of motion of the  knee and hip.  On examination of the right lower extremity, the patient's incisions have healed well without signs of infection.  He is neurovascularly intact.  He does have some tenderness to palpation over the patellar tendon; however, his ligamentous exam of the right knee is stable, including a negative Lachman, varus/valgus stress and posterior drawer examination.  He does also have full range of motion of the right knee and hip.    IMAGING:  X-rays of his bilateral femurs were obtained and reviewed at this visit.  They show intact hardware with good bone callus formation around the fracture site.    ASSESSMENT:  This is a 49 year old gentleman approximately 3 months status post bilateral femur intramedullary nailing.    PLAN:  At this point, we would like for the patient to continue in physical therapy, working on strengthening of both his legs.  It was discussed with him that the pain in the knee is most likely due to muscular weakness surrounding  the knee and that with continued therapy and strengthening this will very likely improve.  We gave him another physical therapy slip to work towards these goals.  He was also notified that he can now start driving.  We gave him another off-work slip to take him through early January.  He was also given a prescription for Robaxin as he has been having problems with some muscle spasms in the right leg.  We would like to see him back in 6 weeks to assess for his progress, at which time we would like to bilateral femur x-rays.      Joaquin Music, MD  Resident  [place] Department of Orthopaedics    Maggie Font, MD  Assistant Professor  [facility] Department of Orthopaedics    ZO/[id number]; D: 09/30/2015 12:53:24; T: 10/01/2015 13:34:01    cc:                      [employer]       [id number]       [place], [place] [address]       I saw and examined the patient.  I reviewed the resident's note.  I agree with the findings and plan of care as documented in the resident's note.  Any  exceptions/additions are edited/noted.    Ernestine Conrad, MD

## 2015-11-05 ENCOUNTER — Encounter (HOSPITAL_BASED_OUTPATIENT_CLINIC_OR_DEPARTMENT_OTHER): Payer: Self-pay

## 2015-11-15 ENCOUNTER — Ambulatory Visit
Admission: RE | Admit: 2015-11-15 | Discharge: 2015-11-15 | Disposition: A | Discharge status: Home / Self Care | Payer: No Typology Code available for payment source | Source: Ambulatory Visit | Attending: ORTHOPEDIC, SPORTS MEDICINE | Admitting: ORTHOPEDIC, SPORTS MEDICINE

## 2015-11-15 ENCOUNTER — Encounter (HOSPITAL_BASED_OUTPATIENT_CLINIC_OR_DEPARTMENT_OTHER): Payer: No Typology Code available for payment source | Admitting: ORTHOPEDIC, SPORTS MEDICINE

## 2015-11-15 ENCOUNTER — Ambulatory Visit (HOSPITAL_BASED_OUTPATIENT_CLINIC_OR_DEPARTMENT_OTHER): Payer: No Typology Code available for payment source | Admitting: ORTHOPEDIC, SPORTS MEDICINE

## 2015-11-15 ENCOUNTER — Ambulatory Visit (HOSPITAL_BASED_OUTPATIENT_CLINIC_OR_DEPARTMENT_OTHER): Payer: No Typology Code available for payment source

## 2015-11-15 ENCOUNTER — Ambulatory Visit (HOSPITAL_BASED_OUTPATIENT_CLINIC_OR_DEPARTMENT_OTHER)
Admission: RE | Admit: 2015-11-15 | Discharge: 2015-11-15 | Disposition: A | Discharge status: Home / Self Care | Payer: No Typology Code available for payment source | Source: Ambulatory Visit | Attending: ORTHOPEDIC, SPORTS MEDICINE | Admitting: ORTHOPEDIC, SPORTS MEDICINE

## 2015-11-15 VITALS — BP 136/86 | HR 84 | Temp 97.2°F

## 2015-11-15 DIAGNOSIS — S7292XD Unspecified fracture of left femur, subsequent encounter for closed fracture with routine healing: Secondary | ICD-10-CM

## 2015-11-15 DIAGNOSIS — S79101D Unspecified physeal fracture of lower end of right femur, subsequent encounter for fracture with routine healing: Secondary | ICD-10-CM

## 2015-11-15 DIAGNOSIS — Z9889 Other specified postprocedural states: Secondary | ICD-10-CM | POA: Insufficient documentation

## 2015-11-15 DIAGNOSIS — S7292XA Unspecified fracture of left femur, initial encounter for closed fracture: Secondary | ICD-10-CM

## 2015-11-15 DIAGNOSIS — S7291XA Unspecified fracture of right femur, initial encounter for closed fracture: Secondary | ICD-10-CM | POA: Insufficient documentation

## 2015-11-15 DIAGNOSIS — S7291XD Unspecified fracture of right femur, subsequent encounter for closed fracture with routine healing: Secondary | ICD-10-CM

## 2015-11-15 DIAGNOSIS — T84119A Breakdown (mechanical) of internal fixation device of unspecified bone of limb, initial encounter: Secondary | ICD-10-CM

## 2015-11-15 DIAGNOSIS — M25561 Pain in right knee: Secondary | ICD-10-CM

## 2015-11-15 DIAGNOSIS — M1711 Unilateral primary osteoarthritis, right knee: Secondary | ICD-10-CM

## 2015-11-15 DIAGNOSIS — X58XXXD Exposure to other specified factors, subsequent encounter: Secondary | ICD-10-CM | POA: Insufficient documentation

## 2015-11-15 DIAGNOSIS — S79102D Unspecified physeal fracture of lower end of left femur, subsequent encounter for fracture with routine healing: Secondary | ICD-10-CM

## 2015-11-16 NOTE — Progress Notes (Addendum)
[facility]                              DEPARTMENT OF [place], [place] [address]                                PATIENT NAME: [name], [name] [facility] [id number]  DATE OF SERVICE:11/15/2015  DATE OF BIRTH: 05/07/1966    PROGRESS NOTE    DATE OF INJURY:  June 27, 2015, bilateral femur fractures status post IM nail.    Claim #:  [id number].     DATE OF SURGERY:  June 28, 2015, intramedullary nail of bilateral femurs.    SUBJECTIVE:  [name] is a 49 year old male who is approximately 4-1/2 months status post the above-mentioned procedures.  The patient has been doing very well, but he continues to have right knee pain.  The patient's thigh pain is resolving and doing much better.  The patient was unable to have physical therapy approved by workers' compensation and so he has not gone to physical therapy for strengthening.  There is concern that he has something wrong with his right knee that needs to be evaluated more thoroughly.  There are no complaints of motor or sensory changes in his bilateral lower extremities.  He also denies any fevers, chills, nausea, vomiting, diarrhea, constipation or any other signs of systemic illness.    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    REVIEW OF SYSTEMS:  Other than that mentioned in history of present illness, all other review of systems is negative.    PHYSICAL EXAMINATION:  On examination the patient is a well-appearing 49 year old male in no acute distress.  He is cooperative and appropriate for examination.  Blood pressure is 136/86, pulse 84, temperature 36.2 degrees Celsius.  On examination of his bilateral femurs, his incisions are well-healed with no signs of erythema, drainage or infection.  The patient has full range of motion of the knees with 0 degrees of extension and 130 degrees of flexion.  The  patient does have some joint line tenderness on the right knee, both medial and lateral.  The patient has some pseudolaxity on the medial joint line, but great endpoint with valgus stress.  He has no laxity to varus stress.  The patient has no pain with patellar motion.  On examination of the patient standing, he does have varus bow to bilateral knees.  The patient walks with a Trendelenburg gait.  He has normal sensation throughout bilateral lower extremities with palpable DP and PT pulses bilaterally.    IMAGING:  X-rays of the patient's bilateral femurs were reviewed on PACS and show well-healed callus around the fracture sites.  The intramedullary nails as well as the locking screws are in good position with no signs of movement.  The right knee was also x-rayed today and reviewed on PACS.  He does have some narrowing of the medial joint space.  There are mild signs of arthritic changes in the medial joint.  He does not appear to have any lateral joint arthritis or patellofemoral joint arthritis.    ASSESSMENT:  A 49 year old male with right knee pain 4 months status post bilateral femur fractures.    PLAN:  At this time, we discussed with the patient we do not think he has any ligamentous injury to his right knee nor do we think there is any intraarticular pathology.  We think most of his pain is coming from a flareup of his mild arthritis secondary to being off his  legs for several months prior to returning to weightbearing as tolerated.  We think the patient will benefit from aggressive physical therapy to work on gait training, quad strengthening as well as hip abductor and external rotator strengthening.  He needs the machines that are available at the outpatient physical therapist office as well as modalities for pain and swelling including but not limited do ultrasound and electric stimulation.  In addition, we offered the patient an injection into his right knee joint to help with some of the arthritic  flare.  The patient was interested in moving forward with this, so we gave him an injection of 1 cc of 1% lidocaine, 1 cc of 0.5% Marcaine and 2 cc of Dexamethasone.  The patient's leg was sterilized with Betadine prior to the injection.  Dr Mercy RidingBal was present and supervised the injection.  He tolerated the injection without complications and there was easy flow of the solution into the knee joint.  We did access the knee joint through the superolateral portal.  Once the injection was completed, we cleaned the patient's skin with alcohol and placed a Band-Aid over the injection site.  The patient ranged his knee and did have improvement of his pain while walking as well as doing step ups in the exam room.  At this time, the patient is still unable to perform his duties at work due to the amount of strengthening he still needs to accomplish.  We gave him an off work slip and we see him back in February.  We are going to have the patient go to physical therapy and return to clinic in 6-8 weeks.      [name] SicksLindsey N. Bravin, MD  Resident  [place] Department of Orthopaedics    [name] FontGeorge Ahmari Duerson, MD  Assistant Professor  [facility] Department of Orthopaedics    [id number]; D: 11/15/2015 12:16:07; T: 11/16/2015 06:40:44    cc: [email or web address]       [id number]       [place], [place] [id number]     I was present and supervised/observed the entire procedure.  [name] ConradGeorge K Dhanvin Szeto, MD      I saw and examined the patient.  I reviewed the resident's note.  I agree with the findings and plan of care as documented in the resident's note.  Any exceptions/additions are edited/noted.    [name] ConradGeorge K Dalissa Lovin, MD

## 2015-11-30 ENCOUNTER — Encounter (HOSPITAL_BASED_OUTPATIENT_CLINIC_OR_DEPARTMENT_OTHER): Payer: Self-pay

## 2016-01-06 ENCOUNTER — Ambulatory Visit (HOSPITAL_BASED_OUTPATIENT_CLINIC_OR_DEPARTMENT_OTHER): Payer: No Typology Code available for payment source | Admitting: ORTHOPEDIC, SPORTS MEDICINE

## 2016-01-06 ENCOUNTER — Ambulatory Visit
Admission: RE | Admit: 2016-01-06 | Discharge: 2016-01-06 | Disposition: A | Discharge status: Home / Self Care | Payer: No Typology Code available for payment source | Source: Ambulatory Visit | Attending: ORTHOPEDIC, SPORTS MEDICINE | Admitting: ORTHOPEDIC, SPORTS MEDICINE

## 2016-01-06 VITALS — BP 134/86 | HR 119 | Temp 98.6°F

## 2016-01-06 DIAGNOSIS — S7292XA Unspecified fracture of left femur, initial encounter for closed fracture: Secondary | ICD-10-CM

## 2016-01-06 DIAGNOSIS — S7291XA Unspecified fracture of right femur, initial encounter for closed fracture: Secondary | ICD-10-CM

## 2016-01-06 DIAGNOSIS — S79101D Unspecified physeal fracture of lower end of right femur, subsequent encounter for fracture with routine healing: Secondary | ICD-10-CM

## 2016-01-06 DIAGNOSIS — S7291XD Unspecified fracture of right femur, subsequent encounter for closed fracture with routine healing: Secondary | ICD-10-CM | POA: Insufficient documentation

## 2016-01-06 DIAGNOSIS — S7292XD Unspecified fracture of left femur, subsequent encounter for closed fracture with routine healing: Secondary | ICD-10-CM | POA: Insufficient documentation

## 2016-01-07 NOTE — Progress Notes (Addendum)
[facility]                              DEPARTMENT OF [place], [place] [address]                                PATIENT NAME: Jos, Cygan [facility] [id number]  DATE OF SERVICE:01/06/2016  DATE OF BIRTH: 09-10-1966    PROGRESS NOTE    DATE OF SURGERY:  June 28, 2015, IM nail bilateral femurs.    DATE OF INJURY:  June 27, 2015.    CLAIM NUMBER: [id number].    HISTORY OF PRESENT ILLNESS:  Mr. Kirschenmann is a 50 year old male roughly 6 months out from bilateral femur fracture, IM nail.  The left was open.  He is doing very well.  He continues to do physical therapy and is progressing well there.  His only issue is still persistent feelings of varus or valgus instability of the right knee with activity.  He has not had any falls, but he states that the right knee gives out on occasion.  He denies any fever or chills.    REVIEW OF SYSTEMS:  Negative other than that found in HPI.    SOCIAL HISTORY:  He is still off work.    OBJECTIVE:  No acute distress.  Head is normocephalic, atraumatic.  Vital signs:  Blood pressure 134/86, pulse 119, temperature 37 degrees Celsius.  The patient is no acute distress.  Head is normocephalic, atraumatic.  His mood is appropriate for the clinical situation.  Evaluation of the bilateral lower extremities reveals well-healed incisions.  He has good quad tone.  He is able to dorsi and plantarflex the ankle with good strength.  Sensation is intact to light touch over the medial and lateral aspects of the lower leg.  He does not walk with an antalgic gait.  He has a stable knee exam, negative Lachman's, negative posterior drawer on the right.  There is a little bit of laxity with varus stressing but appears to have a good endpoint.  Able to dorsi/plantar flex ankle and great toe, SILT over dorsal and plantar foot, BCR all toes.       IMAGING:  X-rays performed in clinic today reviewed on PACS with  Dr. Mercy Riding and show continued bony union and callus healing.  The rods are well positioned without hardware failure.    ASSESSMENT:  A 50 year old male status post bilateral femur fracture, IM nail, doing well.    PLAN:  We discussed with the patient that we are happy with how he has progressed.  We did discuss with him that in the setting of isolated femur fracture we expect the patient to be 6 months to 1 year for full recovery and he has 2, so it is going to take longer than normal for him to return to full activity.  He is making great progress, but needs to continue physical therapy for strengthening and balance.  We gave him a script for work conditioning today to continue that for 6 weeks.  We will see him back in 6 weeks for further evaluation.  He is to be off work until further evaluation.  At  followup visit, will need x-ray of bilateral femurs.  The patient agrees with the plan.  All questions were answered.      Rosine Door, MD  Resident  [place] Department of Orthopaedics    Maggie Font, MD  Assistant Professor  [facility] Department of Orthopaedics    [id number]; D: 01/06/2016 11:41:53; T: 01/07/2016 06:25:03           I saw and examined the patient.  I reviewed the resident's note.  I agree with the findings and plan of care as documented in the resident's note.  Any exceptions/additions are edited/noted.    Ernestine Conrad, MD

## 2016-01-11 ENCOUNTER — Encounter (HOSPITAL_BASED_OUTPATIENT_CLINIC_OR_DEPARTMENT_OTHER): Payer: Self-pay | Admitting: Physician Assistant

## 2016-01-12 NOTE — Letter (Signed)
[facility]                              DEPARTMENT OF [place], [place] [address]                                PATIENT NAME: [name], [name] [facility] [id number]  DATE OF SERVICE:01/11/2016  DATE OF BIRTH: [birth year]    January 11, 2016    [facility] of Workers' Compensation  [address]  [place], [place]  [address]  (Fax:  [phone])    PATIENT NAME: [name], [name]  HOSPITAL [id number]  DATE OF SERVICE:01/11/2016  DATE OF BIRTH: 03/08/1966    CLAIM NUMBER:  [id number].  DATE OF INJURY:  June 27, 2015.    To Whom It May Concern:    This is an appeal letter due to the denial of continued physical therapy that was requested on November 15, 2015 for [name].  The patient underwent bilateral intramedullary nailing of his femurs on June 28, 2015.  Normal recovery from this type of procedure would take six months to one year.  He sustained two femur fractures and his post-operative recovery was complicated by a pulmonary embolism.  These complicating factors will lengthen the time of normal recovery from a single femur fracture. When he was seen on November 15, 2015, he was not using any ambulatory aids, however, he walked with a Trendelenburg gait.  The patient also had noted atrophy of his muscles and weakness, therefore, continued physical therapy was recommended in order to help with gait training, strengthening, and also pain relief.  At this time, we would like to request a reconsideration of approval for the physical therapy that was requested in December 2016.      If you have any further questions, please do not hesitate to contact our office at [phone].    Sincerely,      Rickey Farrier Mable Paris, PA-C   Department of Orthopaedics    Maggie Font, MD  Assistant Professor  [facility] Department of Orthopaedics    [id number]; D: 01/11/2016 15:39:13; T: 01/11/2016 16:03:30    cc: [email or web address] of Workers' Comp       [id number]       [place], [place] [address]             [address] of Workers' Comp       VIA FAX         Fax:  [phone]            Haidyn Chadderdon       [address]       [place], [place] [address]

## 2016-02-15 ENCOUNTER — Other Ambulatory Visit (HOSPITAL_BASED_OUTPATIENT_CLINIC_OR_DEPARTMENT_OTHER): Payer: Self-pay

## 2016-02-15 DIAGNOSIS — S7292XA Unspecified fracture of left femur, initial encounter for closed fracture: Secondary | ICD-10-CM

## 2016-02-15 DIAGNOSIS — S7291XA Unspecified fracture of right femur, initial encounter for closed fracture: Secondary | ICD-10-CM

## 2016-02-24 ENCOUNTER — Ambulatory Visit (HOSPITAL_BASED_OUTPATIENT_CLINIC_OR_DEPARTMENT_OTHER): Payer: No Typology Code available for payment source | Admitting: ORTHOPEDIC, SPORTS MEDICINE

## 2016-03-09 ENCOUNTER — Encounter (HOSPITAL_BASED_OUTPATIENT_CLINIC_OR_DEPARTMENT_OTHER): Payer: Self-pay | Admitting: ORTHOPEDIC, SPORTS MEDICINE

## 2016-03-09 ENCOUNTER — Ambulatory Visit
Admission: RE | Admit: 2016-03-09 | Discharge: 2016-03-09 | Disposition: A | Discharge status: Home / Self Care | Payer: No Typology Code available for payment source | Source: Ambulatory Visit | Attending: ORTHOPEDIC, SPORTS MEDICINE | Admitting: ORTHOPEDIC, SPORTS MEDICINE

## 2016-03-09 ENCOUNTER — Ambulatory Visit (HOSPITAL_BASED_OUTPATIENT_CLINIC_OR_DEPARTMENT_OTHER): Payer: No Typology Code available for payment source | Admitting: ORTHOPEDIC, SPORTS MEDICINE

## 2016-03-09 VITALS — BP 124/86 | HR 71 | Temp 96.4°F

## 2016-03-09 DIAGNOSIS — M79606 Pain in leg, unspecified: Secondary | ICD-10-CM

## 2016-03-09 DIAGNOSIS — S7292XD Unspecified fracture of left femur, subsequent encounter for closed fracture with routine healing: Secondary | ICD-10-CM | POA: Insufficient documentation

## 2016-03-09 DIAGNOSIS — Z4789 Encounter for other orthopedic aftercare: Secondary | ICD-10-CM

## 2016-03-09 DIAGNOSIS — M25461 Effusion, right knee: Secondary | ICD-10-CM | POA: Insufficient documentation

## 2016-03-09 DIAGNOSIS — S7291XA Unspecified fracture of right femur, initial encounter for closed fracture: Secondary | ICD-10-CM

## 2016-03-09 DIAGNOSIS — S7292XB Unspecified fracture of left femur, initial encounter for open fracture type I or II: Secondary | ICD-10-CM

## 2016-03-09 DIAGNOSIS — M25561 Pain in right knee: Secondary | ICD-10-CM | POA: Insufficient documentation

## 2016-03-09 DIAGNOSIS — S7291XD Unspecified fracture of right femur, subsequent encounter for closed fracture with routine healing: Secondary | ICD-10-CM | POA: Insufficient documentation

## 2016-03-09 NOTE — Addendum Note (Signed)
Addended byLeanna Battles: Sabryn Preslar on: 03/09/2016 12:17 PM     Modules accepted: Orders

## 2016-03-09 NOTE — Addendum Note (Signed)
Addended byLeanna Battles: Aisa Schoeppner on: 03/09/2016 12:20 PM     Modules accepted: Orders

## 2016-03-10 NOTE — Progress Notes (Addendum)
[facility] [place] [facility]                              DEPARTMENT OF [place]                                [place], [place] [id number]                                PATIENT NAME: [name], [name]  HOSPITAL UEAVWU:[id number]:[id number]  DATE OF SERVICE:03/09/2016  DATE OF BIRTH: [birth year]    PROGRESS NOTE    CLAIM NUMBER:  [id number].    DATE OF INJURY:  June 27, 2015.    SUBJECTIVE:  [name] is a 50 year old male who presented to clinic today now approximately 9 months out from bilateral IM nail of his femur carried out on June 28, 2015.  He has been doing work conditioning and feels like he is making progress.  The only thing he really notices limitations with is squatting and kneeling.  He also has difficulty coming down ladders.  He does not think he would be able to do it proficiently in an emergent situation.  He is still experiencing right knee pain that is diffusely located throughout the knee.  He notices some mild swelling.    SOCIAL HISTORY:  He has been off of work.  He is interested in returning.    REVIEW OF SYSTEMS:  All systems are negative except for his musculoskeletal complaints.    PHYSICAL EXAMINATION:  Today, [name] had a blood pressure of 124/86, pulse 71, temperature 35.8 degrees Celsius.  He is well-appearing.  He had full extension of both knees.  Flexion was symmetrical.  He had some tenderness to palpation on the medial joint line of the right knee and mild tenderness to palpation on the lateral joint line.  He had very minimal tenderness to palpation on the patellar tendon.  No effusion.  Neurovascularly intact distally.  Incision is well-healed.    STUDIES:  Radiographs taken today demonstrate the femur to be healed.  No signs of change in the hardware.    ASSESSMENT:  Status post intramedullary nail of bilateral femur fracture.    PLAN:  [name] seems to be doing really well.  We will let him return to work on March 11, 2016, with restrictions.  The restrictions  will be limited kneeling and squatting and no climbing of ladders.  This will be in effect until May 04, 2016.  If these restrictions cannot be accommodated, then he will continue work conditioning and we will see him back in 4-6 weeks to assess his progress.  He should not need x-rays at that point.      [name] DemarkAmy Stubblefield, PA-C  [place] Department of Orthopaedics    [name], MD  Assistant Professor  [facility] Department of Orth[facility]edics    [id number]; D: 03/09/2016 12:02:57; T: 03/10/2016 12:11:23    cc: [employer]       [id number]       [place], [place] [id number]     I personally saw and examined the patient. See mid-level's note for additional details. My findings are consistent with Leg pain.

## 2016-05-22 ENCOUNTER — Encounter (HOSPITAL_BASED_OUTPATIENT_CLINIC_OR_DEPARTMENT_OTHER): Payer: Self-pay | Admitting: ORTHOPEDIC, SPORTS MEDICINE

## 2016-05-22 ENCOUNTER — Other Ambulatory Visit (HOSPITAL_BASED_OUTPATIENT_CLINIC_OR_DEPARTMENT_OTHER): Payer: Self-pay

## 2016-05-22 ENCOUNTER — Ambulatory Visit
Payer: No Typology Code available for payment source | Attending: ORTHOPEDIC, SPORTS MEDICINE | Admitting: ORTHOPEDIC, SPORTS MEDICINE

## 2016-05-22 VITALS — BP 156/94 | HR 86 | Temp 96.6°F

## 2016-05-22 DIAGNOSIS — M25561 Pain in right knee: Secondary | ICD-10-CM | POA: Insufficient documentation

## 2016-05-22 DIAGNOSIS — S7291XD Unspecified fracture of right femur, subsequent encounter for closed fracture with routine healing: Secondary | ICD-10-CM | POA: Insufficient documentation

## 2016-05-22 DIAGNOSIS — S7292XD Unspecified fracture of left femur, subsequent encounter for closed fracture with routine healing: Secondary | ICD-10-CM | POA: Insufficient documentation

## 2016-05-25 NOTE — Progress Notes (Signed)
PATIENT NAMOrvil [name]: [name], [name]   HOSPITAL NUMBER:  [id number]  DATE OF SERVICE: 05/22/2016  DATE OF BIRTH:  [birth year]    PROGRESS NOTE    CLAIM NUMBER:  [id number].    DATE OF INJURY:  06/27/2015.     The patient is a 50 year old male who presented to clinic today, now 11 months out from bilateral IM nails at his femurs.  This was carried out on June 28, 2015.  We let him return to work on March 11, 2016.  His restrictions were limited kneeling and squatting, and no climbing of ladders as he did not feel like he could do that proficiently.  He reports he is doing relatively well.  He had a burning pain once in the middle aspect of his left thigh but that has not become reoccurring.  He also describes popping sensation of right knee.  He reports he has a knee brace.  It helps when he wears it.  However, he does not wear it frequently secondary to how hot it has been lately.    SOCIAL HISTORY:  He has returned to work, doing relatively well.    REVIEW OF SYSTEMS:  All systems are negative except for the legs.    PHYSICAL EXAM:  VITAL SIGNS:  Today, the patient had a blood pressure of 156/94, pulse 86, temperature 35.9.   GENERAL:  Well appearing.   EXTREMITIES:  Both lower extremities have full extension of the knee, flexion to 120.  Nonantalgic gait.  The is no knee instability.  Tender to palpation on the patellar tendon.  No tenderness to palpation on the lateral joint line.  Tenderness to palpation on the medial joint line, particularly the right knee.  Neurovascularly intact distally.  No skin rashes.  Incisions are well healed.    ASSESSMENT:  1. Status post intramedullary nails of bilateral femur fractures.  2. Right knee pain.    PLAN:  I will have the patient continue with activities as he is tolerating.  Will let him continue working.  Still   limited kneeling and squatting, and can occasionally climb ladders.  Will see him back in 2 months with x-rays of both femurs.  If he needs anything at all before  then, he will contact our office.        Amy Mable ParisStubblefield, PA-C  [facility]    Ernestine ConradGeorge K Meilani Edmundson, MD  Assistant Professor   [facility]         CC: [employer]   [phone]   [place], [place] [id number]          DD:  05/22/2016 11:41:57  DT:  05/25/2016 11:56:48 UMS  D#:  [id number]  I personally saw and examined the patient. See mid-level's note for additional details. My findings are consistent with Closed fracture of left femur with routine healing, unspecified fracture morphology, unspecified portion of femur, subsequent encounter    Closed fracture of right femur with routine healing, unspecified fracture morphology, unspecified portion of femur, subsequent encounter.

## 2016-07-24 ENCOUNTER — Ambulatory Visit (HOSPITAL_BASED_OUTPATIENT_CLINIC_OR_DEPARTMENT_OTHER): Payer: No Typology Code available for payment source | Admitting: ORTHOPEDIC, SPORTS MEDICINE

## 2016-07-24 ENCOUNTER — Ambulatory Visit
Admission: RE | Admit: 2016-07-24 | Discharge: 2016-07-24 | Disposition: A | Discharge status: Home / Self Care | Payer: No Typology Code available for payment source | Source: Ambulatory Visit | Attending: ORTHOPEDIC, SPORTS MEDICINE | Admitting: ORTHOPEDIC, SPORTS MEDICINE

## 2016-07-24 VITALS — BP 133/80 | HR 81 | Temp 97.9°F

## 2016-07-24 DIAGNOSIS — IMO0002 Reserved for concepts with insufficient information to code with codable children: Secondary | ICD-10-CM

## 2016-07-24 DIAGNOSIS — M1732 Unilateral post-traumatic osteoarthritis, left knee: Secondary | ICD-10-CM

## 2016-07-24 DIAGNOSIS — Z9889 Other specified postprocedural states: Secondary | ICD-10-CM

## 2016-07-24 DIAGNOSIS — M1731 Unilateral post-traumatic osteoarthritis, right knee: Secondary | ICD-10-CM

## 2016-07-24 DIAGNOSIS — S7291XD Unspecified fracture of right femur, subsequent encounter for closed fracture with routine healing: Secondary | ICD-10-CM | POA: Insufficient documentation

## 2016-07-24 DIAGNOSIS — M12562 Traumatic arthropathy, left knee: Secondary | ICD-10-CM

## 2016-07-24 DIAGNOSIS — S72401D Unspecified fracture of lower end of right femur, subsequent encounter for closed fracture with routine healing: Secondary | ICD-10-CM

## 2016-07-24 DIAGNOSIS — M25561 Pain in right knee: Secondary | ICD-10-CM | POA: Insufficient documentation

## 2016-07-24 DIAGNOSIS — M172 Bilateral post-traumatic osteoarthritis of knee: Secondary | ICD-10-CM | POA: Insufficient documentation

## 2016-07-24 DIAGNOSIS — M25562 Pain in left knee: Secondary | ICD-10-CM | POA: Insufficient documentation

## 2016-07-24 DIAGNOSIS — S72402D Unspecified fracture of lower end of left femur, subsequent encounter for closed fracture with routine healing: Secondary | ICD-10-CM

## 2016-07-24 DIAGNOSIS — S7292XD Unspecified fracture of left femur, subsequent encounter for closed fracture with routine healing: Secondary | ICD-10-CM | POA: Insufficient documentation

## 2016-07-25 NOTE — Progress Notes (Signed)
PATIENT NAMOrvil Feil: Vreeland, Joffrey   HOSPITAL NUMBER:  [id number]  DATE OF SERVICE: 07/24/2016  DATE OF BIRTH:  05-29-1966    PROGRESS NOTE    CLAIM #[id number].     Date of injury:  June 27, 2015.    SUBJECTIVE:  Mr. Earlene PlaterWallace is a 50 year old male who presented to clinic today, now approximately 13 months out from bilateral IM femur fractures.  These required intramedullary nailing.  This was carried out on June 28, 2015.  We let him return to work on July 11, 2016.  He reports since returning to work his knee pain has increased some.  He is having bilateral knee pain.  The right knee is a little worse than the left.  He describes the pain being primarily anterior and some medial.  Ice seems to make his symptoms worse.  He reports a hot tub will help with this knee pain, though.  He has been taking Motrin 800 mg in the morning but it does not provide a whole lot of relief.  Occasionally, he will take it throughout the day as well.  He has a knee brace which helps sometimes.    SOCIAL HISTORY:  He has been working full duty.    REVIEW OF SYSTEMS:  All systems are negative except for the shoulder.    OBJECTIVE:  On physical exam today, Mr. Earlene PlaterWallace had a blood pressure of 133/80, pulse 81, temperature 36.6 cm.  He is well appearing. On evaluation of the right lower extremity he has full extension of his knee, flexion to 120.  He was tender to palpation on the medial joint line and also on the patellar tendon.  No tenderness to palpation on the lateral joint line, the quad tendon.  On evaluation of left knee he had full extension of his knee.  Flexion to again to about 120.  He was tender to palpation on the medial joint line, patellar tendon and quad tendon.  No tenderness to palpation on the lateral joint line.  No pain in his hips with internal and external rotation.  Neurovascularly intact distally without any skin rashes.    IMAGING:  Radiographs taken today of both femurs demonstrate femurs to be healed, unchanged  in the hardware when compared to the previous films.  He has also developed some arthritic changes in both knees as well.    ASSESSMENT:  1. Status post IM nail of bilateral femur fractures.  2. Bilateral knee posttraumatic arthritis.    PLAN:  We explained to Mr. Earlene PlaterWallace we believe that he has developed posttraumatic arthritis in his knees following his work-related injury.  We would like the additional diagnosis to be added to his claim.  We feel he has reached maximum medical improvement in regards to his femur fractures and we would recommend him obtain an independent medical exam.  He may require injections or treatment for the knees in the future if they should cause him problems but for now we will see him back on an as-needed basis.        Leanna BattlesAmy Stubblefield, PA-C  [facility]    Ernestine ConradGeorge K Raynie Steinhaus, MD  Assistant Professor   [facility]               CC:   Compensation Workers   [id number]   [place], [place] [phone]       DD:  07/24/2016 11:25:43  DT:  07/25/2016 12:50:11 TP  D#:  [id number]  I personally  saw and examined the patient. See mid-level's note for additional details. My findings are consistent with Orthopedic aftercare for healing traumatic long bone fracture    Post-traumatic arthritis of left lower leg    Post-traumatic arthritis of lower leg, right.

## 2017-08-05 ENCOUNTER — Emergency Department (HOSPITAL_COMMUNITY): Payer: Self-pay

## 2017-08-06 ENCOUNTER — Observation Stay (HOSPITAL_COMMUNITY): Payer: BC Managed Care – PPO

## 2017-08-06 ENCOUNTER — Observation Stay
Admit: 2017-08-06 | Discharge: 2017-08-07 | Disposition: A | Discharge status: Home / Self Care | Payer: BC Managed Care – PPO | Source: Other Acute Inpatient Hospital

## 2017-08-06 ENCOUNTER — Inpatient Hospital Stay (HOSPITAL_COMMUNITY): Payer: BC Managed Care – PPO

## 2017-08-06 ENCOUNTER — Encounter (HOSPITAL_COMMUNITY): Payer: Self-pay

## 2017-08-06 DIAGNOSIS — Z79899 Other long term (current) drug therapy: Secondary | ICD-10-CM

## 2017-08-06 DIAGNOSIS — Z86711 Personal history of pulmonary embolism: Secondary | ICD-10-CM | POA: Insufficient documentation

## 2017-08-06 DIAGNOSIS — R0789 Other chest pain: Secondary | ICD-10-CM

## 2017-08-06 DIAGNOSIS — R079 Chest pain, unspecified: Secondary | ICD-10-CM | POA: Diagnosis present

## 2017-08-06 DIAGNOSIS — E7800 Pure hypercholesterolemia, unspecified: Secondary | ICD-10-CM

## 2017-08-06 DIAGNOSIS — I10 Essential (primary) hypertension: Secondary | ICD-10-CM

## 2017-08-06 DIAGNOSIS — Z87891 Personal history of nicotine dependence: Secondary | ICD-10-CM | POA: Insufficient documentation

## 2017-08-06 DIAGNOSIS — Z9889 Other specified postprocedural states: Secondary | ICD-10-CM | POA: Insufficient documentation

## 2017-08-06 DIAGNOSIS — F329 Major depressive disorder, single episode, unspecified: Secondary | ICD-10-CM | POA: Insufficient documentation

## 2017-08-06 DIAGNOSIS — F32A Depression, unspecified: Secondary | ICD-10-CM

## 2017-08-06 DIAGNOSIS — Z86718 Personal history of other venous thrombosis and embolism: Secondary | ICD-10-CM | POA: Insufficient documentation

## 2017-08-06 DIAGNOSIS — I330 Acute and subacute infective endocarditis: Secondary | ICD-10-CM

## 2017-08-06 DIAGNOSIS — I82402 Acute embolism and thrombosis of unspecified deep veins of left lower extremity: Secondary | ICD-10-CM

## 2017-08-06 DIAGNOSIS — E782 Mixed hyperlipidemia: Secondary | ICD-10-CM

## 2017-08-06 HISTORY — DX: Depression, unspecified: F32.A

## 2017-08-06 HISTORY — DX: Arthropathy, unspecified: M12.9

## 2017-08-06 HISTORY — DX: Pure hypercholesterolemia, unspecified: E78.00

## 2017-08-06 HISTORY — DX: Essential (primary) hypertension: I10

## 2017-08-06 HISTORY — DX: Major depressive disorder, single episode, unspecified: F32.9

## 2017-08-06 HISTORY — DX: Acute embolism and thrombosis of unspecified deep veins of left lower extremity (CMS HCC): I82.402

## 2017-08-06 LAB — TROPONIN-I
TROPONIN I: <0.03 ng/mL (ref <=0.03)
TROPONIN I: <0.03 ng/mL (ref <=0.03)
TROPONIN I: <0.03 ng/mL (ref <=0.03)

## 2017-08-06 LAB — LAVENDER TOP TUBE

## 2017-08-06 LAB — CREATINE KINASE (CK), TOTAL, SERUM OR PLASMA
CREATINE KINASE: 480 U/L — ABNORMAL HIGH (ref 49–397)
CREATINE KINASE: 418 U/L — ABNORMAL HIGH (ref 49–397)
CREATINE KINASE: 353 U/L (ref 49–397)
CREATINE KINASE: 293 U/L (ref 49–397)

## 2017-08-06 LAB — CREATINE KINASE (CK), MB FRACTION
CK-MB: 8.8 ng/mL — ABNORMAL HIGH (ref 0.3–4.0)
CK-MB: 7.4 ng/mL — ABNORMAL HIGH (ref 0.3–4.0)
CK-MB: 6.7 ng/mL — ABNORMAL HIGH (ref 0.3–4.0)

## 2017-08-06 LAB — SEDIMENTATION RATE: ERYTHROCYTE SEDIMENTATION RATE (ESR): 12 mm/h (ref 0–15)

## 2017-08-06 MED ORDER — HYDROCHLOROTHIAZIDE 25 MG TABLET
25.0000 mg | ORAL_TABLET | Freq: Every day | ORAL | Status: DC
Start: 2017-08-06 — End: 2017-08-07
  Administered 2017-08-06 – 2017-08-07 (×2): 25 mg via ORAL
  Filled 2017-08-06 (×2): qty 1

## 2017-08-06 MED ORDER — ASPIRIN 81 MG CHEWABLE TABLET
81.0000 mg | CHEWABLE_TABLET | Freq: Every day | ORAL | Status: DC
Start: 2017-08-06 — End: 2017-08-07
  Administered 2017-08-06 – 2017-08-07 (×2): 81 mg via ORAL
  Filled 2017-08-06 (×2): qty 1

## 2017-08-06 MED ORDER — SIMVASTATIN 40 MG TABLET
40.00 mg | ORAL_TABLET | Freq: Every evening | ORAL | Status: DC
Start: 2017-08-06 — End: 2017-08-07
  Administered 2017-08-06: 40 mg via ORAL
  Filled 2017-08-06: qty 1

## 2017-08-06 MED ORDER — NICOTINE 14 MG/24 HR DAILY TRANSDERMAL PATCH
14.0000 mg | MEDICATED_PATCH | Freq: Every day | TRANSDERMAL | Status: DC
Start: 2017-08-06 — End: 2017-08-07
  Administered 2017-08-06 – 2017-08-07 (×2): 0 mg via TRANSDERMAL
  Filled 2017-08-06: qty 1

## 2017-08-06 MED ORDER — ONDANSETRON HCL (PF) 4 MG/2 ML INJECTION SOLUTION
4.0000 mg | Freq: Four times a day (QID) | INTRAMUSCULAR | Status: DC | PRN
Start: 2017-08-06 — End: 2017-08-07

## 2017-08-06 MED ORDER — NITROGLYCERIN 2 % TRANSDERMAL OINTMENT - PACKET
0.50 [in_us] | TOPICAL_OINTMENT | Freq: Two times a day (BID) | TRANSDERMAL | Status: DC
Start: 2017-08-06 — End: 2017-08-07
  Administered 2017-08-06 (×2): 0.5 [in_us] via TOPICAL
  Administered 2017-08-07: 0 [in_us] via TOPICAL
  Filled 2017-08-06 (×3): qty 2

## 2017-08-06 MED ORDER — MAGNESIUM HYDROXIDE 400 MG/5 ML ORAL SUSPENSION
15.0000 mL | Freq: Every day | ORAL | Status: DC | PRN
Start: 2017-08-06 — End: 2017-08-07

## 2017-08-06 MED ORDER — ACETAMINOPHEN 325 MG TABLET
650.0000 mg | ORAL_TABLET | ORAL | Status: DC | PRN
Start: 2017-08-06 — End: 2017-08-07
  Administered 2017-08-06: 650 mg via ORAL

## 2017-08-06 MED ORDER — ENOXAPARIN 40 MG/0.4 ML SUB-Q SYRINGE - EAST
40.0000 mg | INJECTION | Freq: Every day | SUBCUTANEOUS | Status: DC
Start: 2017-08-06 — End: 2017-08-07
  Administered 2017-08-06 – 2017-08-07 (×2): 40 mg via SUBCUTANEOUS
  Filled 2017-08-06: qty 0
  Filled 2017-08-06: qty 0.4

## 2017-08-06 MED ORDER — LISINOPRIL 20 MG TABLET
40.0000 mg | ORAL_TABLET | Freq: Every day | ORAL | Status: DC
Start: 2017-08-06 — End: 2017-08-07
  Administered 2017-08-06 – 2017-08-07 (×2): 40 mg via ORAL
  Filled 2017-08-06 (×2): qty 2

## 2017-08-06 MED ORDER — DULOXETINE 60 MG CAPSULE,DELAYED RELEASE
60.0000 mg | DELAYED_RELEASE_CAPSULE | Freq: Every day | ORAL | Status: DC
Start: 2017-08-06 — End: 2017-08-07
  Administered 2017-08-06 – 2017-08-07 (×2): 60 mg via ORAL
  Filled 2017-08-06 (×2): qty 1

## 2017-08-06 MED ORDER — ACETAMINOPHEN 325 MG TABLET
650.00 mg | ORAL_TABLET | Freq: Four times a day (QID) | ORAL | Status: DC | PRN
Start: 2017-08-06 — End: 2017-08-07
  Filled 2017-08-06: qty 2

## 2017-08-06 MED ADMIN — ketorolac 30 mg/mL (1 mL) injection solution: ORAL | @ 20:00:00

## 2017-08-06 NOTE — Nurses Notes (Signed)
Patient resting in bed . Patient is alert and oriented x3. Apical is regular, sinus rhythm 70 on the monitor . Denies any chest pain pressure or heaviness . States that when he did have the discomfort it came around under his ribs whenever he would take a deep breath. Abdomen is soft nontender with bowel sounds present in all  four quadrants . Pedals are palpable with no edema noted . Patient denies any needs at this time. Call light in reach. will continue to monitor.

## 2017-08-06 NOTE — Care Plan (Signed)
Problem: Cardiac: ACS (Acute Coronary Syndrome) (Adult)  Prevent and manage potential problems including:  1. cardiovascular structural defects  2. chest pain (angina)  3. dysrhythmia/arrhythmia  4. embolism  5. heart failure/shock  6. ischemia leading to infarction  7. pericarditis  8. situational response  Goal: Signs and Symptoms of Listed Potential Problems Will be Absent, Minimized or Managed (Cardiac: ACS)  Signs and symptoms of listed potential problems will be absent, minimized or managed by discharge/transition of care (reference Cardiac: ACS (Acute Coronary Syndrome) (Adult) CPG).  Outcome: Ongoing (see interventions/notes)

## 2017-08-06 NOTE — H&P (Addendum)
[facility]  Hospitalist  Admission H&P    Date of Service:  08/06/2017  [facility], 51 y.o. male  Date of Admission:  08/06/2017  Date of Birth:  November 29, 1966  PCP: Darnelle Going, MD    Chief Complaint:      Chest pain    HPI:   [name] is a 51 y.o. male with history of  hypertension, high cholesterol, and pulmonary embolus following repair of bilateral femur fractures 2 years ago presenting with acute onset of chest pain.  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    It was severe in intensity.  It was associated with diaphoresis and shortness of breath.  It was pleuritic in nature.  He had no cough or wheezing.  He had no lower extremity edema.  He presented to [facility] ER for evaluation.  His CTA of his chest was negative and his D-dimer was negative.  His cardiac enzymes were negative.  His EKG showed no acute abnormality.  He was transferred to [facility] for further evaluation and management.    Pain lasted for little over an hour and was constant during that time.  At the time my interview the patient states the pain has significantly improved.  He still has some pressure in his chest from time to time especially with deep inspiration.    History:      Past Medical History:   Diagnosis Date   . Arthropathy    . Crush accident 06/27/2015    Crushed at thigh level by sand mixer     . 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 Depression 08/06/2017   . Fracture of left femur (CMS HCC) 06/27/2015    Mid-shaft Ortho consult OR I&D and IM Nail WBAT    . Fracture of right femur (CMS HCC) 06/27/2015    Mid-shaft Ortho consult OR IM Nail WBAT    . High cholesterol 08/06/2017   . HTN (hypertension)    . Hypercholesteremia    . Hypertension 08/06/2017   . PE (pulmonary embolism) 07/15/2015   . Wears glasses         Past Surgical History:   Procedure Laterality Date   . HX FOOT SURGERY       bone spur removal   . HX TONSILLECTOMY        Medications Prior to Admission     Prescriptions    DULoxetine (CYMBALTA DR) 30 mg Oral Capsule, Delayed Release(E.C.)    Take 60 mg by mouth Once a day     hydroCHLOROthiazide (HYDRODIURIL) 25 mg Oral Tablet    Take 25 mg by mouth Once a day    Ibuprofen (MOTRIN) 800 mg Oral Tablet    Take 800 mg by mouth Once a day    lisinopril (PRINIVIL) 40 mg Oral Tablet    Take 40 mg by mouth Once a day    multivitamin Oral Tablet    Take 1 Tab by mouth Once a day    simvastatin (ZOCOR) 40 mg Oral Tablet    Take 40 mg by mouth Every evening          reports that he has quit smoking. His smoking use included Cigarettes. He smoked 0.25 packs per day. He uses smokeless tobacco. He reports that he does not drink alcohol or use illicit drugs.     Family Medical  History     None           No Known Allergies     ROS:     Positive for pleuritic chest pain and diaphoresis per HPI.  Positive for shortness of breath now resolved.  No palpitations.  No lower extremity edema.  No recent injury.  No fever or chills.   No nasal congestion or rhinorrhea.   No abdominal pain, nausea or vomiting.  No dysuria or hematuria.  No lower extremity edema.  No musculoskeletal injury.  No focal neurologic deficits.   Rest of 10 point review of systems is negative except per HPI.      Physical Exam:  Temperature: 36.1 C (96.9 F) Heart Rate: 99 BP (Non-Invasive): 119/76   Respiratory Rate: 18 SpO2-1: 99 % Pain Score (Numeric, Faces): 0      General:  No acute distress.  Resting comfortably in bed.  Head:  Normocephalic.  Atraumatic.  Eyes:  Extra-ocular motion intact.    ENT:  Moist mucous membranes.  Neck:  Supple.    Heart:  Regular rate and rhythm.  No murmurs, rubs, or gallops.  Lungs:  Clear to auscultation bilaterally.  Non-labored breathing.  Abd:  Soft.  Non-distended.  Non-tender to palpation.  Bowel sounds present.  GU:  No suprapubic or costovertebral angle tenderness.  Skin:  Warm and dry with  normal turgor.  No rash.  Ext:  No edema.  MSK:  Atraumatic.   Neuro:  Alert, awake, and oriented.  No focal deficits.  Psych:  Mood stable.    Laboratory Data:     Results for orders placed or performed during the hospital encounter of 08/06/17 (from the past 24 hour(s))   TROPONIN-I   Result Value Ref Range    TROPONIN I <0.03 <=0.03 ng/mL   CREATINE KINASE (CK), MB FRACTION   Result Value Ref Range    CK-MB 8.8 (H) 0.3 - 4.0 ng/mL   CREATINE KINASE (CK), TOTAL, SERUM   Result Value Ref Range    CREATINE KINASE 480 (H) 49 - 397 U/L   TROPONIN-I   Result Value Ref Range    TROPONIN I <0.03 <=0.03 ng/mL   CREATINE KINASE (CK), MB FRACTION   Result Value Ref Range    CK-MB 7.4 (H) 0.3 - 4.0 ng/mL   CREATINE KINASE (CK), TOTAL, SERUM   Result Value Ref Range    CREATINE KINASE 418 (H) 49 - 397 U/L   LAVENDER TOP TUBE   Result Value Ref Range    RAINBOW/EXTRA TUBE AUTO RESULT Yes        D-dimer [facility] was 100.    Imaging Studies:    None    CTA at [facility] showed no acute cardiopulmonary process.    Assessment and Plan:      51 year old man with history of hypertension, high cholesterol, and prior pulmonary embolus  related to surgery presenting with acute onset of chest pain, rule out ACS.    Active Hospital Problems    Diagnosis   . Chest pain   . Hypertension   . High cholesterol        place patient on observation status.  Check serial cardiac enzymes EKGs to rule acute coronary syndrome.  If the pain persists,  would consult Cardiology and consider echocardiogram and stress test.  Differential diagnosis other than acute coronary syndrome includes pericarditis or pleurisy given pleuritic nature of symptoms.  There is no sign of pneumonia, pleural  effusion, or pericardial effusion on CT of the chest.   Repeat chest x-ray.  Check ESR.    Begin aspirin daily.  Continue home lisinopril and HCTZ.  Continue home Zocor.  Nitropaste.  Continue home ibuprofen p.r.n..  Lovenox for DVT  prophylaxis.    Stark Jock, MD

## 2017-08-06 NOTE — Care Plan (Signed)
Problem: Patient Care Overview (Adult,OB)  Goal: Plan of Care Review(Adult,OB)  The patient and/or their representative will communicate an understanding of their plan of care   Outcome: Ongoing (see interventions/notes)  Goal: Individualization/Patient Specific Goal(Adult/OB)  Outcome: Ongoing (see interventions/notes)  Goal: Interdisciplinary Rounds/Family Conf  Outcome: Ongoing (see interventions/notes)

## 2017-08-07 ENCOUNTER — Observation Stay (HOSPITAL_BASED_OUTPATIENT_CLINIC_OR_DEPARTMENT_OTHER): Payer: BC Managed Care – PPO

## 2017-08-07 DIAGNOSIS — R0789 Other chest pain: Secondary | ICD-10-CM

## 2017-08-07 DIAGNOSIS — I10 Essential (primary) hypertension: Secondary | ICD-10-CM

## 2017-08-07 DIAGNOSIS — R079 Chest pain, unspecified: Secondary | ICD-10-CM

## 2017-08-07 DIAGNOSIS — E785 Hyperlipidemia, unspecified: Secondary | ICD-10-CM

## 2017-08-07 LAB — BASIC METABOLIC PANEL
ANION GAP: 7 mmol/L (ref 4–13)
BUN: 19 mg/dL (ref 8–20)
CALCIUM: 9.6 mg/dL (ref 8.9–10.3)
CHLORIDE: 99 mmol/L — ABNORMAL LOW (ref 101–111)
CO2 TOTAL: 30 mmol/L (ref 22–32)
CREATININE: 1.23 mg/dL (ref 0.80–1.40)
ESTIMATED GFR: >60 mL/min/{1.73_m2} (ref >60)
GLUCOSE: 97 mg/dL (ref 70–99)
POTASSIUM: 3.4 mmol/L — ABNORMAL LOW (ref 3.6–5.1)
SODIUM: 136 mmol/L (ref 135–145)

## 2017-08-07 LAB — TRANSTHORACIC ECHOCARDIOGRAM - ADULT
AV mean gradient: 5
AV peak velocity post stress: 148
Interventricular Septum Diastolic Thickness by 2D: 0 cm
LVIDD - 2D: 0 cm
LVIDS 2D: 0 cm
LVPWD: 0 cm
Please see Linked Document for Final Report.: ABNORMAL
TR VELOCITY: 171

## 2017-08-07 LAB — CBC
HCT: 44.4 % (ref 42.0–52.0)
HGB: 14.8 g/dL (ref 13.5–18.0)
MCH: 31.4 pg (ref 28.0–33.0)
MCHC: 33.4 g/dL (ref 32.0–37.0)
MCV: 94.1 fL (ref 78.0–100.0)
MPV: 9.4 fL
PLATELETS: 280 10*3/uL (ref 130–400)
RBC: 4.72 10*6/uL (ref 4.70–5.40)
RDW: 13 % (ref 9.9–16.5)
WBC: 4.8 10*3/uL (ref 4.5–11.0)

## 2017-08-07 LAB — LIPID PANEL
CHOLESTEROL: 189 mg/dL (ref 100–200)
HDL CHOL: 35 mg/dL — ABNORMAL LOW (ref >=39)
LDL DIRECT: 136 mg/dL — ABNORMAL HIGH (ref <=130)
TRIGLYCERIDES: 121 mg/dL (ref 35–149)

## 2017-08-07 LAB — CREATINE KINASE (CK), MB FRACTION: CK-MB: 4.8 ng/mL — ABNORMAL HIGH (ref 0.3–4.0)

## 2017-08-07 LAB — TROPONIN-I: TROPONIN I: <0.03 ng/mL (ref <=0.03)

## 2017-08-07 LAB — CREATINE KINASE (CK), TOTAL, SERUM OR PLASMA: CREATINE KINASE: 257 U/L (ref 49–397)

## 2017-08-07 LAB — MAGNESIUM: MAGNESIUM: 2.1 mg/dL (ref 1.8–2.5)

## 2017-08-07 MED ORDER — NICOTINE 14 MG/24 HR DAILY TRANSDERMAL PATCH
14.0000 mg | MEDICATED_PATCH | Freq: Every day | TRANSDERMAL | 0 refills | Status: AC
Start: 2017-08-08 — End: ?

## 2017-08-07 MED ORDER — ASPIRIN 81 MG CHEWABLE TABLET
81.0000 mg | CHEWABLE_TABLET | Freq: Every day | ORAL | 2 refills | Status: AC
Start: 2017-08-08 — End: ?

## 2017-08-07 MED ORDER — IBUPROFEN 400 MG TABLET
400.00 mg | ORAL_TABLET | Freq: Four times a day (QID) | ORAL | Status: DC | PRN
Start: 2017-08-07 — End: 2017-08-07
  Administered 2017-08-07 (×2): 400 mg via ORAL
  Filled 2017-08-07: qty 1

## 2017-08-07 MED ADMIN — sodium chloride 0.9 % (flush) injection syringe: TOPICAL | @ 09:00:00

## 2017-08-07 MED ADMIN — enoxaparin 40 mg/0.4 mL subcutaneous syringe: SUBCUTANEOUS | @ 10:00:00

## 2017-08-07 NOTE — Discharge Instructions (Signed)
You will need to follow up with Cardiology 08/23/17 2:30pm  You will be followed up with cardiac enzymes and an out-patient stress test

## 2017-08-07 NOTE — Nurses Notes (Signed)
Patient dc at this time via ambulatory with work makes  IV site dc with no s/s of infection  Dc instructions given and patient voiced understanding of teaching and importance of follow up  Lab slips given for f/u labs  Escorted out with hospital staff

## 2017-08-07 NOTE — Care Management Notes (Signed)
Tobacco tx note:  Pt only uses smokeless tobacco when he works.  If he's off 6 days he does not use in those 6 days.  He is refusing NRT while here and is declining quit-line.

## 2017-08-07 NOTE — Care Plan (Signed)
Problem: Patient Care Overview (Adult,OB)  Goal: Plan of Care Review(Adult,OB)  The patient and/or their representative will communicate an understanding of their plan of care   Outcome: Ongoing (see interventions/notes)  Goal: Individualization/Patient Specific Goal(Adult/OB)  Outcome: Ongoing (see interventions/notes)  Goal: Interdisciplinary Rounds/Family Conf  Outcome: Ongoing (see interventions/notes)    Problem: Cardiac: ACS (Acute Coronary Syndrome) (Adult)  Prevent and manage potential problems including: 1. cardiovascular structural defects 2. chest pain (angina) 3. dysrhythmia/arrhythmia 4. embolism 5. heart failure/shock 6. ischemia leading to infarction 7. pericarditis 8. situational response  Goal: Signs and Symptoms of Listed Potential Problems Will be Absent, Minimized or Managed (Cardiac: ACS)  Signs and symptoms of listed potential problems will be absent, minimized or managed by discharge/transition of care (reference Cardiac: ACS (Acute Coronary Syndrome) (Adult) CPG).  Outcome: Ongoing (see interventions/notes)

## 2017-08-07 NOTE — Consults (Signed)
[name]  NOTE    Name:  [name]   DOB: June 21, 1966   MRN: [id number]   Date:  08/07/2017    PCP: Darnelle Going, MD     Hospital Day:  LOS: 0 days     IMPRESSION/PLAN:   1.Chest pain:  We will obtain an echocardiogram and await findings.  I will check with Cardiologist to see if he would like any further cardiac work-up. We will continue aspirin 81 mg p.o. daily, lisinopril 40 mg p.o. daily, HCTZ 25 mg p.o. daily, Nitro-Bid 2%   b.i.d., and Zocor 40 mg p.o. daily.      2.  Hypertension:  The patient's blood pressure has been well controlled while at the hospital.  He should continue on hypertensive medications as previously prescribed.  He should continue with a heart healthy diet as well.      3. Hyperlipidemia:  Lipid panel reviewed.  Continue Zocor.  The patient should continue with a heart healthy diet.    Chief Complaint   No chief complaint on file.      History of Present Illness:   [name] is a 51 y.o. male with known history of  hypertension, hyperlipidemia, and PE and DVT status post crushing injury and bilateral femur fracture from 2 years ago, who was admitted for complaints of dyspnea and pleuritic chest pain with activity. He reports shortness of breatht and inability to take a deep breath. With subsequent breaths he noted pressure and constriction of anterior chest.This was accompanied with diaphoresis.  He was not performing any exertional activity at the time according to him. 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 He denies any pain radiation, or alleviating factors. He denies any significant family cardiac history. He is on medications at home for hypertension and hyperlipidemia. He denies any past cardiac history. He reports a history of DVT and PE that occurred with a crushing injury and fracture of bilateral femurs. This has resolved and had no reoccurance. He quit smoking after his injury 2 years ago.  The CT angio of the  chest  and D-dimer were negative.  EKG showed normal sinus rhythm without any abnormalities.   CK-MB and CPK were mildly elevated.  Troponin was negative.  Chest x-ray showed no active disease.  Patient denies any recent injuries or excessive workouts.  Past Medical History:     Past Medical History:   Diagnosis Date   . Arthropathy    . Crush accident 06/27/2015    Crushed at thigh level by sand mixer     . 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 Depression 08/06/2017   . Fracture of left femur (CMS HCC) 06/27/2015    Mid-shaft Ortho consult OR I&D and IM Nail WBAT    . Fracture of right femur (CMS HCC) 06/27/2015    Mid-shaft Ortho consult OR IM Nail WBAT    . High cholesterol 08/06/2017   . HTN (hypertension)    . Hypercholesteremia    . Hypertension 08/06/2017   . PE (pulmonary embolism) 07/15/2015   . Wears glasses            Past Surgical History:     Past Surgical History:   Procedure Laterality Date   . HX FOOT SURGERY      bone spur removal   . HX TONSILLECTOMY  Family History:     Family Medical History       The patient reports no significant family cardiac history              Social History:     Social History   Substance Use Topics   . Smoking status: Former Smoker- quit 2 years ago     Packs/day: 0.25     Types: Cigarettes   . Smokeless tobacco: Current User   . Alcohol use No       Current Medications:     Current Facility-Administered Medications:  acetaminophen (TYLENOL) tablet 650 mg Oral Q6H PRN   acetaminophen (TYLENOL) tablet 650 mg Oral Q4H PRN   aspirin chewable tablet 81 mg 81 mg Oral Daily   DULoxetine (CYMBALTA) delayed release capsule 60 mg Oral Daily   enoxaparin (LOVENOX) 40 mg/0.4 mL SubQ injection 40 mg Subcutaneous Daily   hydroCHLOROthiazide (HYDRODIURIL) tablet 25 mg Oral Daily   lisinopril (PRINIVIL) tablet 40 mg Oral Daily   magnesium hydroxide (MILK OF MAGNESIA) 429m per 584moral liquid 15 mL Oral Daily PRN   nicotine (NICODERM CQ)  transdermal patch (mg/24 hr) 14 mg Transdermal Daily   nitroGLYCERIN (NITRO-BID) 2 % topical ointment 0.5 Inch Apply Topically 2x/day   ondansetron (ZOFRAN) 2 mg/mL injection 4 mg Intravenous Q6H PRN   simvastatin (ZOCOR) tablet 40 mg Oral QPM        Home Medications:     Medications Prior to Admission     Prescriptions    DULoxetine (CYMBALTA DR) 30 mg Oral Capsule, Delayed Release(E.C.)    Take 60 mg by mouth Once a day     hydroCHLOROthiazide (HYDRODIURIL) 25 mg Oral Tablet    Take 25 mg by mouth Once a day    Ibuprofen (MOTRIN) 800 mg Oral Tablet    Take 800 mg by mouth Once a day    lisinopril (PRINIVIL) 40 mg Oral Tablet    Take 40 mg by mouth Once a day    multivitamin Oral Tablet    Take 1 Tab by mouth Once a day    simvastatin (ZOCOR) 40 mg Oral Tablet    Take 40 mg by mouth Every evening          Allergies:   No Known Allergies    ROS:   All other ROS are negative except for what is noted in HPI and the following, if any.  Constitutional: positive for diaphoresis  Respiratory: positive for pleurisy/chest pain  Cardiovascular: positive for chest pressure/discomfort    IN's / OUT's:   I/O last 24 hours:    Intake/Output Summary (Last 24 hours) at 08/07/17 0859  Last data filed at 08/06/17 2300   Gross per 24 hour   Intake              600 ml   Output                0 ml   Net              600 ml     I/O current shift:       DIAGNOSTIC STUDIES:   Laboratory Findings:  Results for orders placed or performed during the hospital encounter of 08/06/17   TROPONIN-I   Result Value Ref Range    TROPONIN I <0.03 <=0.03 ng/mL   CREATINE KINASE (CK), MB FRACTION   Result Value Ref Range    CK-MB 8.8 (H) 0.3 - 4.0 ng/mL  CREATINE KINASE (CK), TOTAL, SERUM   Result Value Ref Range    CREATINE KINASE 480 (H) 49 - 397 U/L   TROPONIN-I   Result Value Ref Range    TROPONIN I <0.03 <=0.03 ng/mL   TROPONIN-I   Result Value Ref Range    TROPONIN I <0.03 <=0.03 ng/mL   CREATINE KINASE (CK), MB FRACTION   Result Value Ref Range     CK-MB 7.4 (H) 0.3 - 4.0 ng/mL   CREATINE KINASE (CK), MB FRACTION   Result Value Ref Range    CK-MB 6.7 (H) 0.3 - 4.0 ng/mL   CREATINE KINASE (CK), TOTAL, SERUM   Result Value Ref Range    CREATINE KINASE 418 (H) 49 - 397 U/L   CREATINE KINASE (CK), TOTAL, SERUM   Result Value Ref Range    CREATINE KINASE 353 49 - 397 U/L   CREATINE KINASE (CK), TOTAL, SERUM   Result Value Ref Range    CREATINE KINASE 293 49 - 397 U/L   LAVENDER TOP TUBE   Result Value Ref Range    RAINBOW/EXTRA TUBE AUTO RESULT Yes    SEDIMENTATION RATE   Result Value Ref Range    ERYTHROCYTE SEDIMENTATION RATE (ESR) 12 0 - 15 mm/hr   BASIC METABOLIC PANEL   Result Value Ref Range    SODIUM 136 135 - 145 mmol/L    POTASSIUM 3.4 (L) 3.6 - 5.1 mmol/L    CHLORIDE 99 (L) 101 - 111 mmol/L    CO2 TOTAL 30 22 - 32 mmol/L    ANION GAP 7 4 - 13 mmol/L    CALCIUM 9.6 8.9 - 10.3 mg/dL    GLUCOSE 97 70 - 99 mg/dL    BUN 19 8 - 20 mg/dL    CREATININE 1.23 0.80 - 1.40 mg/dL    ESTIMATED GFR >60 >60 mL/min/1.79m2   CBC   Result Value Ref Range    WBC 4.8 4.5 - 11.0 x10^3/uL    RBC 4.72 4.70 - 5.40 x10^6/uL    HGB 14.8 13.5 - 18.0 g/dL    HCT 44.4 42.0 - 52.0 %    MCV 94.1 78.0 - 100.0 fL    MCH 31.4 28.0 - 33.0 pg    MCHC 33.4 32.0 - 37.0 g/dL    RDW 13.0 9.9 - 16.5 %    PLATELETS 280 130 - 400 x10^3/uL    MPV 9.4 fL   CREATINE KINASE (CK), TOTAL, SERUM   Result Value Ref Range    CREATINE KINASE 257 49 - 397 U/L   LIPID PANEL   Result Value Ref Range    HDL CHOL 35 (L) >=39 mg/dL    TRIGLYCERIDES 121 35 - 149 mg/dL    VLDL CALC 24 <30 mg/dL    CHOLESTEROL 189 100 - 200 mg/dL    LDL DIRECT 136 (H) <=130 mg/dL   MAGNESIUM   Result Value Ref Range    MAGNESIUM 2.1 1.8 - 2.5 mg/dL   TROPONIN-I   Result Value Ref Range    TROPONIN I <0.03 <=0.03 ng/mL   CREATINE KINASE (CK), MB FRACTION   Result Value Ref Range    CK-MB 4.8 (H) 0.3 - 4.0 ng/mL       Radiology:      PHYSICAL EXAMINATION:     BP 104/66  Pulse 54  Temp 36.4 C (97.5 F)  Resp 18  Ht 1.88 m (6'  2")  SpO2 98%     Gen: This is a  51 y.o. male who appears to be in no acute distress. Well-appearing  Skin: Warm and dry. No lesions noted.   Neck: Supple, Trachea midline.   Cardiac: Regular, normal S1, S2. No murmurs.  No clicks or rubs.  Vascular: No JVD or carotid bruits.  No edema noted, Pulses +2 bilaterally  Lungs: Symmetrical excursion. Respirations non-labored, clear to auscultation bilaterally.  No wheezes.  No rales.  No rhonchi.  Abdomen: Soft, non-tender, non-distended. Normal bowel sounds.  Neurologic:  Alert and oriented, No deficits noted.  Musculoskeletal: Moves all extremeties. No limitations note.  Psych: Mood and affect are appropriate.      Seen in Conjunction with Dr. Philippa Chester, FNP  08/07/2017, 08:59  [facility], Section of Cardiology  [facility] Department of Medicine    I personally saw and examined the patient. See Nurse Practitioner's note for additional details. My findings are consistent with above.  Atypical chest pain.  Non-specific elevation of CPK and CPK MB with normal troponin.  ECHO reviewed.  No wall motion abnormality noted.  Would have patient follow up in clinic for repeat labs and reassess symptoms.  Should return to hospital if further chest pain.    Alvira Philips, MD

## 2017-08-07 NOTE — Discharge Summary (Signed)
[facility]  DISCHARGE SUMMARY      PATIENT NAMEJaze, [name]  MRN:  [id number]  DOB:  Feb 18, 1966    ENCOUNTER START DATE:  08/06/2017  INPATIENT ADMISSION DATE:     ATTENDING PHYSICIAN: [name] Pert, MD  PRIMARY CARE PHYSICIAN: [name] Octave, MD     ADMISSION DIAGNOSIS: <principal problem not specified>  No chief complaint on file.          DISCHARGE DIAGNOSIS: Noncardiac chest pain  Hospital Problems) (* Primary Problem)    Diagnosis Date Noted   . Chest pain 08/06/2017   . Hypertension 08/06/2017   . High cholesterol 08/06/2017      Resolved Hospital Problems    Diagnosis Date Noted Date Resolved   No resolved problems to display.     Active Non-Hospital Problems    Diagnosis Date Noted   . Depression 08/06/2017      DISCHARGE MEDICATIONS:     Current Discharge Medication List      START taking these medications.       Details    aspirin 81 mg Tablet, Chewable   Start taking on:  08/08/2017    81 mg, Oral, Daily   Qty:  30 Tab   Refills:  2       nicotine 14 mg/24 hr Patch 24 hr   Commonly known as:  NICODERM CQ   Start taking on:  08/08/2017    14 mg, Transdermal, Daily   Qty:  30 Patch   Refills:  0         CONTINUE these medications - NO CHANGES were made during your visit.       Details    DULoxetine 30 mg Capsule, Delayed Release(E.C.)   Commonly known as:  CYMBALTA DR    60 mg, Oral, Daily   Refills:  0       hydroCHLOROthiazide 25 mg Tablet   Commonly known as:  HYDRODIURIL    25 mg, Oral, Daily   Refills:  0       Ibuprofen 800 mg Tablet   Commonly known as:  MOTRIN    800 mg, Oral, Daily   Refills:  0       lisinopril 40 mg Tablet   Commonly known as:  PRINIVIL    40 mg, Oral, Daily   Refills:  0       multivitamin Tablet    1 Tab, Oral, Daily   Refills:  0       simvastatin 40 mg Tablet   Commonly known as:  ZOCOR    40 mg, Oral, QPM   Refills:  0           DISCHARGE INSTRUCTIONS:     CREATINE KINASE (CK), TOTAL, SERUM     CREATINE KINASE (CK), MB FRACTION     DISCHARGE INSTRUCTION -  CARDIAC DIET   Diet: CARDIAC DIET      DISCHARGE INSTRUCTION - ACTIVITY   Activity: GRADUALLY INCREASE ACTIVITY AS TOLERATED      SCHEDULE FOLLOW-UP - [facility] - CARDIOLOGY - [facility]   Patient needs follow-up cardiac panel. If continues to be elevated, possible OP stress and discontinue statin.   Follow-up in: 2 WEEKS    Reason for visit: HOSPITAL DISCHARGE    Which clinic? [facility]       Follow-up Information     Follow up with [facility] .    Specialty:  Cardiology  Contact information:    [address]  [place] [id number]  [phone]    Additional information:    From [address] ([place]): Take Exit 1 ([address]/[place]) onto [address] / [place] toward [place], continue for approximately 8 miles. Turn Left onto [address] just before [facility] in on the Left.*From [address] (in [place]): Take Exit 179 ([place]) onto [address] and continue for approximately 16 miles. Turn Left onto [place] to cross over the [facility] [place]. Turn Left onto [address] / [address] and continue approximately 28 miles. Continue to the Right onto [address] to cross over the [facility] [place]. Continue straight to merge onto [place] and proceed approximately 27 miles. Turn Right onto [address] just after [facility] in on the Left.*From [address]: Follow [address] to [place]. Turn Right onto [address], proceed less than a mile. Turn Right onto [address], proceed less than a mile. Merge onto [address] and continue approximately 1 mile. Turn Right onto [address] just after [facility] in on the Left.        REASON FOR HOSPITALIZATION AND HOSPITAL COURSE:  This is a 51 y.o., male  admitted with chest pain and shortness of breath.    Pain was pleuritic in nature.  CTA negative.  D-dimer negative.  Cardiac markers negative.  Echocardiogram    Unremarkable.    SIGNIFICANT PHYSICAL FINDINGS:   Clear lungs.  No murmur.  No cardiac rub.  SIGNIFICANT LAB:   Unremarkable D-dimer and cardiac enzymes.  SIGNIFICANT RADIOLOGY:   Negative CTA.  CONSULTATIONS:   Cardiology.  PROCEDURES PERFORMED:   Echocardiogram.        COURSE IN HOSPITAL:   Patient was admitted to telemetry.  Cardiac markers were obtained.  Serial EKGs were obtained.  Cardiology consult.  Echocardiogram was obtained.  Patient did well.  Patient had no angina-like symptoms.  The patient's pain became more predict.  Patient's pain abated at time of discharge.  No significant shortness of breath.  No diaphoresis.  No nausea.  Patient's lungs were clear discharge.  Heart was regular that murmur.  Abdomen is benign.  No edema.    DOES PATIENT HAVE ADVANCED DIRECTIVES:  No, Information Offered and Refused    ADVANCED CARE PLANNING - POST form not discussed    CONDITION ON DISCHARGE: VS Stable    DISCHARGE DISPOSITION:  Home discharge     Copies sent to Care Team       Relationship Specialty Notifications Start End    [name], [name] W, MD PCP - General EXTERNAL  09/30/15     Phone: [phone] Fax: [phone]         [address] [place] [id number]            [name] Sparrowobert Jedrek Dinovo, MD

## 2017-08-07 NOTE — Nurses Notes (Signed)
SR  NO C/P CHEST PAIN  ECHO TODAY

## 2017-08-20 LAB — CREATINE KINASE (CK), MB FRACTION: CK-MB: 6 ng/mL

## 2017-08-20 LAB — CREATINE KINASE (CK), TOTAL, SERUM: CREATINE KINASE (CK): 282

## 2017-08-22 ENCOUNTER — Telehealth (HOSPITAL_BASED_OUTPATIENT_CLINIC_OR_DEPARTMENT_OTHER): Payer: Self-pay | Admitting: Family

## 2017-08-22 NOTE — Telephone Encounter (Signed)
Left message with wife regarding elevated CK of 282. We suggested the patient discontinue zocor for one month and have PCP recheck CK in 1 month to see if this resolves. The patient is working out of time and will not be able to make his appointment in [employer]. I gave her our phone number incase his PCP needs any information.   Ramon Dredge, FNP  08/22/2017, 13:27

## 2017-08-23 ENCOUNTER — Encounter (INDEPENDENT_AMBULATORY_CARE_PROVIDER_SITE_OTHER): Payer: Self-pay | Admitting: Family

## 2017-08-24 ENCOUNTER — Other Ambulatory Visit (HOSPITAL_COMMUNITY): Payer: Self-pay | Admitting: Family

## 2017-08-24 DIAGNOSIS — I330 Acute and subacute infective endocarditis: Secondary | ICD-10-CM

## 2017-09-07 ENCOUNTER — Encounter (HOSPITAL_BASED_OUTPATIENT_CLINIC_OR_DEPARTMENT_OTHER): Payer: Self-pay | Admitting: Family

## 2018-11-11 ENCOUNTER — Ambulatory Visit (HOSPITAL_BASED_OUTPATIENT_CLINIC_OR_DEPARTMENT_OTHER): Payer: No Typology Code available for payment source

## 2018-11-11 ENCOUNTER — Ambulatory Visit
Admission: RE | Admit: 2018-11-11 | Discharge: 2018-11-11 | Disposition: A | Discharge status: Home / Self Care | Payer: No Typology Code available for payment source | Source: Ambulatory Visit | Attending: ORTHOPEDIC, SPORTS MEDICINE | Admitting: ORTHOPEDIC, SPORTS MEDICINE

## 2018-11-11 ENCOUNTER — Other Ambulatory Visit (HOSPITAL_BASED_OUTPATIENT_CLINIC_OR_DEPARTMENT_OTHER): Payer: Self-pay | Admitting: ORTHOPEDIC, SPORTS MEDICINE

## 2018-11-11 DIAGNOSIS — M12562 Traumatic arthropathy, left knee: Secondary | ICD-10-CM

## 2018-11-11 DIAGNOSIS — M12569 Traumatic arthropathy, unspecified knee: Secondary | ICD-10-CM | POA: Insufficient documentation

## 2018-11-11 DIAGNOSIS — S7291XA Unspecified fracture of right femur, initial encounter for closed fracture: Secondary | ICD-10-CM

## 2018-11-11 DIAGNOSIS — G8918 Other acute postprocedural pain: Secondary | ICD-10-CM

## 2018-11-11 DIAGNOSIS — S7292XA Unspecified fracture of left femur, initial encounter for closed fracture: Secondary | ICD-10-CM

## 2018-11-11 DIAGNOSIS — M12561 Traumatic arthropathy, right knee: Secondary | ICD-10-CM

## 2018-11-12 ENCOUNTER — Other Ambulatory Visit (HOSPITAL_BASED_OUTPATIENT_CLINIC_OR_DEPARTMENT_OTHER): Payer: Self-pay | Admitting: Physician Assistant

## 2018-11-12 NOTE — Progress Notes (Signed)
Radiographs take on November 12, 2018 were reviewed of bilateral femurs and knees.  The hardware is unchanged when compared to 2017.  He has noted degenerative changes most prominent in the medial compartments.  [facility]  I informed them that we requested the additional diagnosis to be added in 2017. It was never added so it was requested that we sent it again. A new letter was sent.

## 2018-12-12 ENCOUNTER — Encounter (HOSPITAL_BASED_OUTPATIENT_CLINIC_OR_DEPARTMENT_OTHER): Payer: Self-pay

## 2018-12-12 NOTE — Nursing Note (Signed)
C9 for additional diagnosis faxed to [facility].  Confirmation of fax received.

## 2019-05-12 ENCOUNTER — Emergency Department (INDEPENDENT_AMBULATORY_CARE_PROVIDER_SITE_OTHER): Payer: Self-pay

## 2019-05-12 ENCOUNTER — Other Ambulatory Visit: Payer: Self-pay

## 2019-05-12 ENCOUNTER — Emergency Department
Admission: EM | Admit: 2019-05-12 | Discharge: 2019-05-12 | Disposition: A | Discharge status: Home / Self Care | Payer: Self-pay | Source: Home / Self Care

## 2019-05-12 DIAGNOSIS — R05 Cough: Secondary | ICD-10-CM

## 2019-05-12 DIAGNOSIS — R053 Chronic cough: Secondary | ICD-10-CM

## 2019-05-12 HISTORY — DX: Essential (primary) hypertension: I10

## 2019-05-12 HISTORY — DX: Pure hypercholesterolemia, unspecified: E78.00

## 2019-05-12 MED ORDER — AZITHROMYCIN 250 MG PO TABS: 250.0000 mg | ORAL_TABLET | Freq: Every day | ORAL | 0 refills | Status: DC

## 2019-05-12 MED ORDER — BENZONATATE 100 MG PO CAPS: 100.0000 mg | ORAL_CAPSULE | Freq: Three times a day (TID) | ORAL | 0 refills | Status: DC

## 2019-05-12 NOTE — ED Triage Notes (Signed)
Pt has nasal congestion about 3 months ago.  Has been coughing for the last 2 months.  Gets better, then starts again.  Has been productive with light brown phlegm.  Denies fever, chest pain with coughing.

## 2019-05-12 NOTE — Discharge Instructions (Signed)
°  Please establish care with a primary care provider for recheck of symptoms next week if not improving and for ongoing healthcare needs including your high blood pressure and high cholesterol.    Please take antibiotics as prescribed and be sure to complete entire course even if you start to feel better to ensure infection does not come back.

## 2019-05-12 NOTE — ED Provider Notes (Signed)
[facility]    CSN: [id number] Arrival date & time: 05/12/19  1118     History   Chief Complaint Chief Complaint  Patient presents with  . Cough    HPI Kyle Warren is a 53 y.o. male.   HPI Kyle Warren is a 53 y.o. male presenting to UC with c/o 2-3 months of cough and congestion.  [place] but developed the cough in March while he was working in [place].  Denies known sick contacts. He has tried cough drops with mild relief.  Cough occasionally keeps him up at night. Denies fever, chills, chest pain or SOB. No hx of asthma or pneumonia.      Past Medical History:  Diagnosis Date  . High cholesterol   . Hypertension     There are no active problems to display for this patient.   Past Surgical History:  Procedure Laterality Date  . FRACTURE SURGERY     bilat femor       Home Medications    Prior to Admission medications   Medication Sig Start Date End Date Taking? Authorizing Provider  azithromycin (ZITHROMAX) 250 MG tablet Take 1 tablet (250 mg total) by mouth daily. Take first 2 tablets together, then 1 every day until finished. 05/12/19   Lurene ShadowPhelps, Lucy Woolever O, PA-C  benzonatate (TESSALON) 100 MG capsule Take 1-2 capsules (100-200 mg total) by mouth every 8 (eight) hours. 05/12/19   Lurene ShadowPhelps, Shaqueta Casady O, PA-C    Family History History reviewed. No pertinent family history.  Social History Social History   Tobacco Use  . Smoking status: Never Smoker  . Smokeless tobacco: Current User  Substance Use Topics  . Alcohol use: Yes    Comment: 3-4/ week  . Drug use: Not Currently     Allergies   Patient has no known allergies.   Review of Systems Review of Systems  Constitutional: Negative for chills and fever.  HENT: Positive for congestion. Negative for ear pain, sore throat, trouble swallowing and voice change.   Respiratory: Positive for cough. Negative for shortness of breath.    Cardiovascular: Negative for chest pain and palpitations.  Gastrointestinal: Negative for abdominal pain, diarrhea, nausea and vomiting.  Musculoskeletal: Negative for arthralgias, back pain and myalgias.  Skin: Negative for rash.     Physical Exam Triage Vital Signs ED Triage Vitals  Enc Vitals Group     BP 05/12/19 1139 124/85     Pulse Rate 05/12/19 1139 85     Resp 05/12/19 1139 20     Temp 05/12/19 1139 97.7 F (36.5 C)     Temp Source 05/12/19 1139 Tympanic     SpO2 05/12/19 1139 97 %     Weight 05/12/19 1140 257 lb (116.6 kg)     Height 05/12/19 1140 6\' 2"  (1.88 m)     Head Circumference --      Pe[facility] Flow --      Pain Score 05/12/19 1140 0     Pain Loc --      Pain Edu? --      Excl. in GC? --    No data found.  Updated Vital Signs BP 124/85 (BP Location: Right Arm)   Pulse 85   Temp 97.7 F (36.5 C) (Tympanic)   Resp 20   Ht 6\' 2"  (1.88 m)   Wt 257 lb (116.6 kg)   SpO2 97%   BMI 33.00 kg/m   Visual  Acuity Right Eye Distance:   Left Eye Distance:   Bilateral Distance:    Right Eye Near:   Left Eye Near:    Bilateral Near:     Physical Exam Vitals signs and nursing note reviewed.  Constitutional:      General: He is not in acute distress.    Appearance: Normal appearance. He is well-developed. He is not ill-appearing, toxic-appearing or diaphoretic.  HENT:     Head: Normocephalic and atraumatic.     Right Ear: Tympanic membrane normal.     Left Ear: Tympanic membrane normal.     Nose: Nose normal.     Mouth/Throat:     Lips: Pink.     Mouth: Mucous membranes are moist.     Pharynx: Oropharynx is clear. Uvula midline.  Neck:     Musculoskeletal: Normal range of motion.  Cardiovascular:     Rate and Rhythm: Normal rate and regular rhythm.  Pulmonary:     Effort: Pulmonary effort is normal. No respiratory distress.     Breath sounds: Normal breath sounds. No stridor. No wheezing, rhonchi or rales.     Comments: Occasional mildly productive  cough during exam. No respiratory distress. Musculoskeletal: Normal range of motion.  Skin:    General: Skin is warm and dry.  Neurological:     Mental Status: He is alert and oriented to person, place, and time.  Psychiatric:        Behavior: Behavior normal.      UC Treatments / Results  Labs (all labs ordered are listed, but only abnormal results are displayed) Labs Reviewed - No data to display  EKG None  Radiology Dg Chest 2 View  Result Date: 05/12/2019 CLINICAL DATA:  Cough EXAM: CHEST - 2 VIEW COMPARISON:  None. FINDINGS: There is no edema or consolidation. The heart size and pulmonary vascularity are normal. No adenopathy. No bone lesions. IMPRESSION: No edema or consolidation. Electronically Signed   By: Lowella Grip III M.D.   On: 05/12/2019 12:00    Procedures Procedures (including critical care time)  Medications Ordered in UC Medications - No data to display  Initial Impression / Assessment and Plan / UC Course  I have reviewed the triage vital signs and the nursing notes.  Pertinent labs & imaging results that were available during my care of the patient were reviewed by me and considered in my medical decision making (see chart for details).     Reassured pt of normal CXR Vitals are: WNL Doubt ACS or PE Will cover for atypical bacteria with azithromycin given persistence of cough. Pt does not meet criteria for Covid-19 testing Home care info provided Resource guide to establish care with a PCP also provided.  Final Clinical Impressions(s) / UC Diagnoses   Final diagnoses:  Persistent cough for 3 weeks or longer     Discharge Instructions      Please establish care with a primary care provider for recheck of symptoms next week if not improving and for ongoing healthcare needs including your high blood pressure and high cholesterol.    Please take antibiotics as prescribed and be sure to complete entire course even if you start to feel  better to ensure infection does not come back.     ED Prescriptions    Medication Sig Dispense Auth. Provider   azithromycin (ZITHROMAX) 250 MG tablet Take 1 tablet (250 mg total) by mouth daily. Take first 2 tablets together, then 1 every day until finished. 6 tablet  Doroteo GlassmanPhelps, Annett Boxwell O, PA-C   benzonatate (TESSALON) 100 MG capsule Take 1-2 capsules (100-200 mg total) by mouth every 8 (eight) hours. 21 capsule Lurene ShadowPhelps, Jaydon Avina O, PA-C     Controlled Substance Prescriptions [employer] Controlled Substance Registry consulted? Not Applicable   Rolla Platehelps, Adyson Vanburen O, PA-C 05/12/19 1352

## 2019-08-15 ENCOUNTER — Other Ambulatory Visit: Payer: Self-pay

## 2020-04-30 ENCOUNTER — Other Ambulatory Visit: Payer: Self-pay

## 2020-04-30 ENCOUNTER — Encounter (HOSPITAL_BASED_OUTPATIENT_CLINIC_OR_DEPARTMENT_OTHER): Payer: Self-pay | Admitting: Emergency Medicine

## 2020-04-30 ENCOUNTER — Emergency Department (HOSPITAL_BASED_OUTPATIENT_CLINIC_OR_DEPARTMENT_OTHER): Payer: Self-pay

## 2020-04-30 ENCOUNTER — Emergency Department (HOSPITAL_BASED_OUTPATIENT_CLINIC_OR_DEPARTMENT_OTHER)
Admission: EM | Admit: 2020-04-30 | Discharge: 2020-05-01 | Disposition: A | Discharge status: Home / Self Care | Payer: Self-pay | Attending: Emergency Medicine | Admitting: Emergency Medicine

## 2020-04-30 DIAGNOSIS — R0989 Other specified symptoms and signs involving the circulatory and respiratory systems: Secondary | ICD-10-CM | POA: Insufficient documentation

## 2020-04-30 DIAGNOSIS — Z79899 Other long term (current) drug therapy: Secondary | ICD-10-CM | POA: Insufficient documentation

## 2020-04-30 DIAGNOSIS — I10 Essential (primary) hypertension: Secondary | ICD-10-CM | POA: Insufficient documentation

## 2020-04-30 DIAGNOSIS — F17210 Nicotine dependence, cigarettes, uncomplicated: Secondary | ICD-10-CM | POA: Insufficient documentation

## 2020-04-30 DIAGNOSIS — M79604 Pain in right leg: Secondary | ICD-10-CM | POA: Insufficient documentation

## 2020-04-30 NOTE — ED Notes (Signed)
Called pt to re-assess v/s and pt did not answer. Pt had previously told staff he was waiting outside.

## 2020-04-30 NOTE — ED Provider Notes (Signed)
[facility] Provider Note: Georgena Spurling, MD, FACEP  CSN: [id number] MRN: [id number] ARRIVAL: 04/30/20 at [place]: [place]  Leg Pain   HISTORY OF PRESENT ILLNESS  04/30/20 11:37 PM Kyle Warren is a 54 y.o. male who had bilateral femur fractures 5 years ago.  He is having pain in his right leg at what he describes as the location of the screws.  He rates his pain as a 10 out of 10, worse with ambulation.  He is walking with a limp.  His right foot has been swelling for the past 2 weeks.  He has had a cough recently which he attributes to seasonal allergies ("this happens every year") which has not been adequately relieved with fexofenadine.   Past Medical History:  Diagnosis Date  . High cholesterol   . Hypertension     Past Surgical History:  Procedure Laterality Date  . FRACTURE SURGERY     bilat femor  . TONSILLECTOMY      No family history on file.  Social History   Tobacco Use  . Smoking status: Current Every Day Smoker    Packs/day: 0.50  . Smokeless tobacco: Current User  Substance Use Topics  . Alcohol use: Yes    Comment: rarely  . Drug use: Not Currently    Prior to Admission medications   Medication Sig Start Date End Date Taking? Authorizing Provider  fexofenadine (ALLEGRA) 60 MG tablet Take 60 mg by mouth 2 (two) times daily.   Yes [provider]  HYDROcodone-acetaminophen (NORCO) 5-325 MG tablet Take 1 tablet by mouth every 4 (four) hours as needed for severe pain. 05/01/20   Costa Jha, Jenny Reichmann, MD    Allergies Patient has no known allergies.   REVIEW OF SYSTEMS  Negative except as noted here or in the History of Present Illness.   PHYSICAL EXAMINATION  Initial Vital Signs Blood pressure 126/84, pulse 82, temperature 99.4 F (37.4 C), temperature source Oral, resp. rate 16, height 6\' 2"  (1.88 m), weight 116.7 kg, SpO2 100 %.  Examination General: Well-developed, well-nourished male in no acute  distress; appearance consistent with age of record HENT: normocephalic; atraumatic Eyes: Normal appearance Neck: supple Heart: regular rate and rhythm Lungs: clear to auscultation bilaterally; frequent cough Abdomen: soft; nondistended; nontender; bowel sounds present Extremities: No deformity; tenderness of right thigh and knee with mild edema of right foot; pulses normal Neurologic: Awake, alert and oriented; motor function intact in all extremities and symmetric; no facial droop Skin: Warm and dry Psychiatric: Normal mood and affect   RESULTS  Summary of this visit's results, reviewed and interpreted by myself:   EKG Interpretation  Date/Time:    Ventricular Rate:    PR Interval:    QRS Duration:   QT Interval:    QTC Calculation:   R Axis:     Text Interpretation:        Laboratory Studies: Results for orders placed or performed during the hospital encounter of 04/30/20 (from the past 24 hour(s))  D-dimer, quantitative (not at [facility])     Status: Abnormal   Collection Time: 04/30/20 11:44 PM  Result Value Ref Range   D-Dimer, Quant 1.39 (H) 0.00 - 0.50 ug/mL-FEU   Imaging Studies: DG Femur Min 2 Views Right  Result Date: 04/30/2020 CLINICAL DATA:  55 year old male with pain in the right lower extremity. EXAM: RIGHT FEMUR 2 VIEWS COMPARISON:  None. FINDINGS: There is no acute fracture or dislocation. Right femoral intramedullary rod  appears intact. Old healed distal femoral diaphysis fracture deformity. The soft tissues are unremarkable. IMPRESSION: No acute fracture or dislocation. Electronically Signed   By: Elgie Collard M.D.   On: 04/30/2020 19:53    ED COURSE and MDM  Nursing notes, initial and subsequent vitals signs, including pulse oximetry, reviewed and interpreted by myself.  Vitals:   04/30/20 1854 04/30/20 1856 04/30/20 2226  BP: (!) 138/95  126/84  Pulse: 95  82  Resp: 16  16  Temp: 99.4 F (37.4 C)    TempSrc: Oral    SpO2: 97%  100%  Weight:   116.7 kg   Height:  6\' 2"  (1.88 m)    Medications  Rivaroxaban (XARELTO) tablet 15 mg (has no administration in time range)    We will give patient a dose of Xarelto and have him return for Doppler ultrasound later today.  PROCEDURES  Procedures   ED DIAGNOSES     ICD-10-CM   1. Suspected DVT (deep vein thrombosis)  R09.89   2. Pain in right leg  M79.604        Taiten Brawn, 04-10-2003, MD 05/01/20 [phone]

## 2020-04-30 NOTE — ED Triage Notes (Signed)
Pt had bilateral femur fx 5 yrs ago.  Is having pain in his right leg where the screws are. Walks with limp.  Sts his right foot has been swelling for the past 2 weeks.

## 2020-05-01 ENCOUNTER — Other Ambulatory Visit: Payer: Self-pay

## 2020-05-01 ENCOUNTER — Ambulatory Visit (HOSPITAL_BASED_OUTPATIENT_CLINIC_OR_DEPARTMENT_OTHER)
Admission: RE | Admit: 2020-05-01 | Discharge: 2020-05-01 | Disposition: A | Discharge status: Home / Self Care | Payer: Self-pay | Source: Ambulatory Visit | Attending: Emergency Medicine | Admitting: Emergency Medicine

## 2020-05-01 DIAGNOSIS — M7989 Other specified soft tissue disorders: Secondary | ICD-10-CM | POA: Insufficient documentation

## 2020-05-01 DIAGNOSIS — M79604 Pain in right leg: Secondary | ICD-10-CM | POA: Insufficient documentation

## 2020-05-01 LAB — D-DIMER, QUANTITATIVE: D-Dimer, Quant: 1.39 ug/mL-FEU — ABNORMAL HIGH (ref 0.00–0.50)

## 2020-05-01 MED ORDER — HYDROCODONE-ACETAMINOPHEN 5-325 MG PO TABS: 1.0000 | ORAL_TABLET | ORAL | 0 refills | Status: AC | PRN

## 2020-05-01 MED ORDER — RIVAROXABAN 15 MG PO TABS
15.0000 mg | ORAL_TABLET | Freq: Once | ORAL | Status: AC
  Administered 2020-05-01: 15 mg via ORAL
  Filled 2020-05-01: qty 1

## 2022-01-28 IMAGING — US US EXTREM LOW VENOUS*R*
1 series · 13 of 24 positions shown · non-contrast
Comparison: None.

CLINICAL DATA: Right leg pain and swelling, initial encounter



[Series 1: us extrem low venous*right* · 13 of 33 slices shown]
[im 1/33]
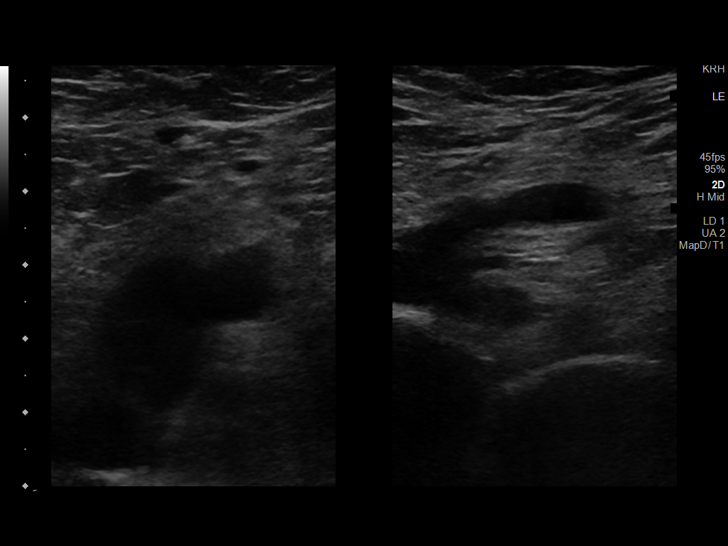
[im 3/33]
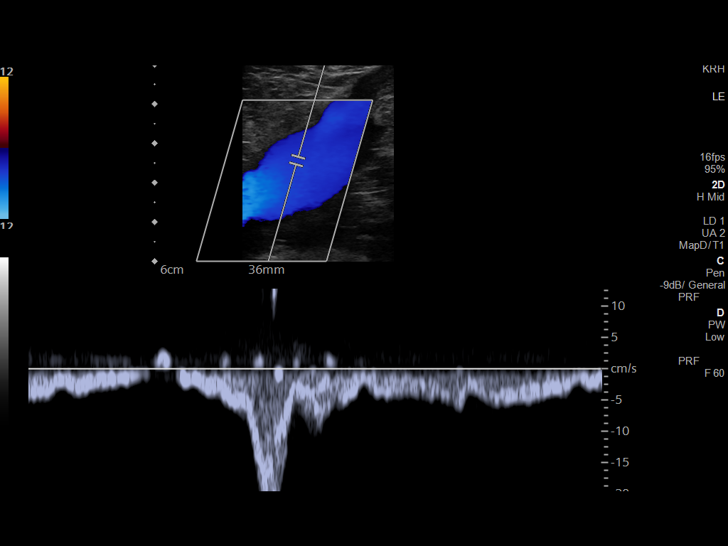
[im 6/33]
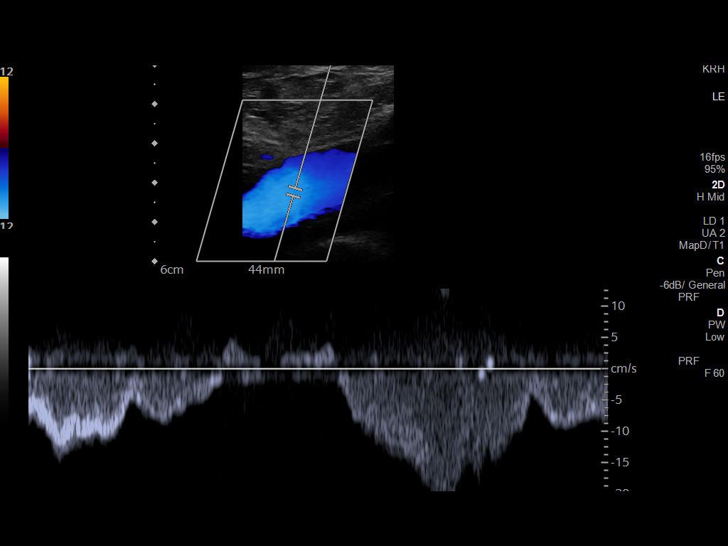
[im 9/33]
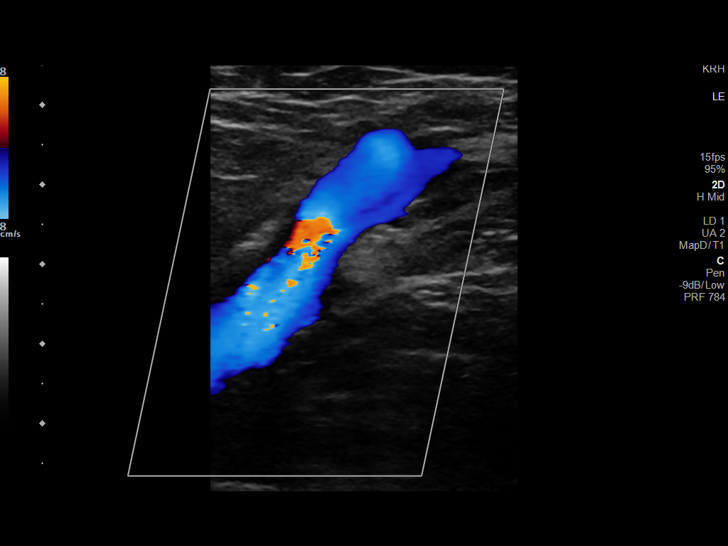
[im 12/33]
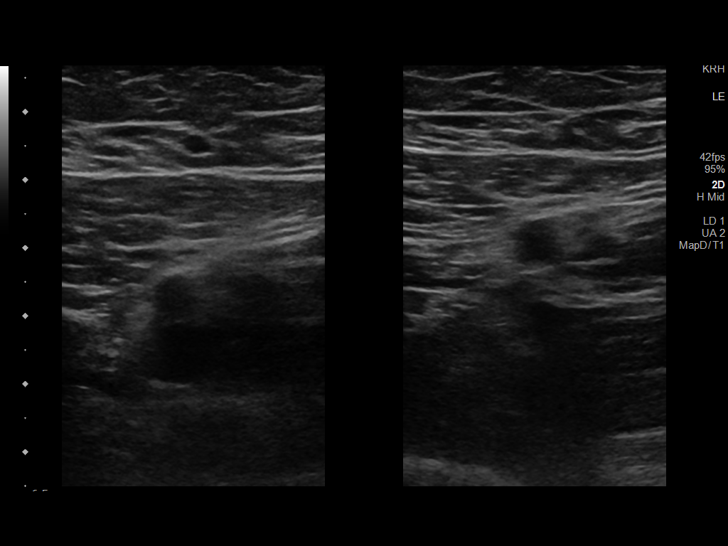
[im 14/33]
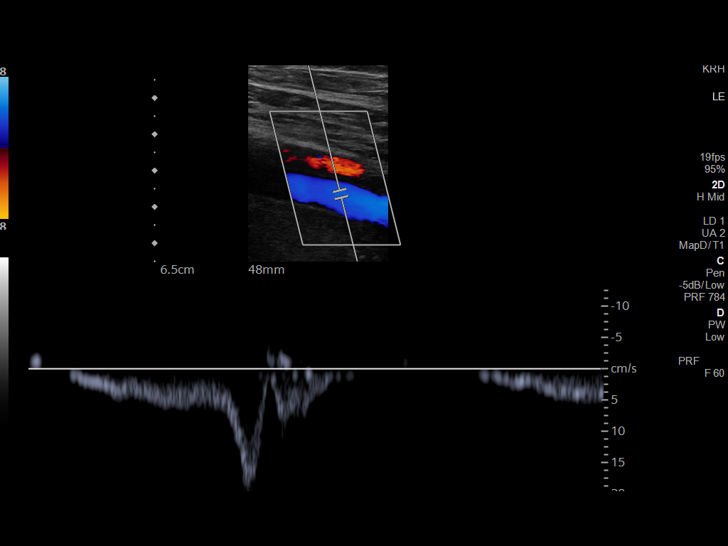
[im 17/33]
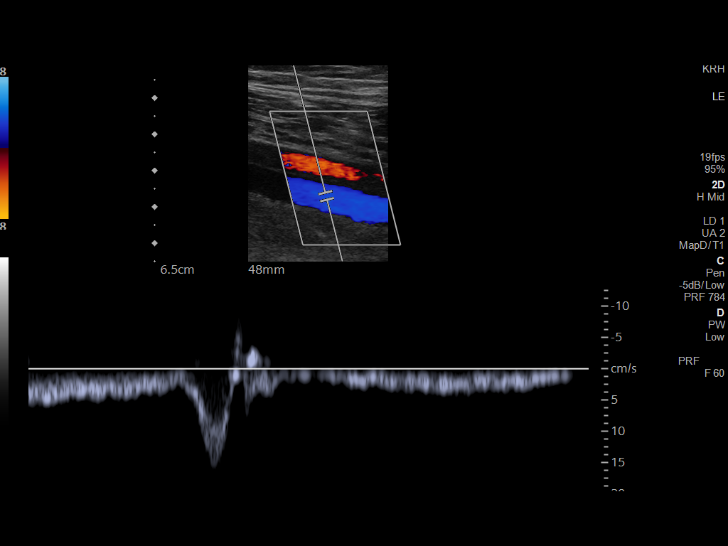
[im 19/33]
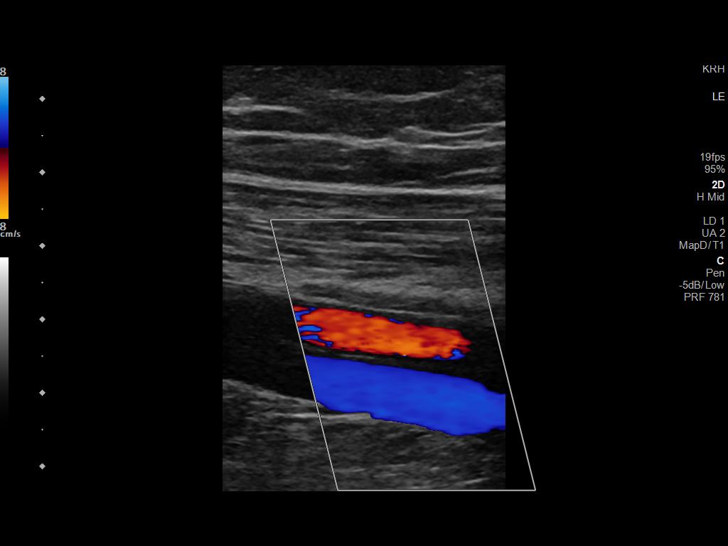
[im 21/33]
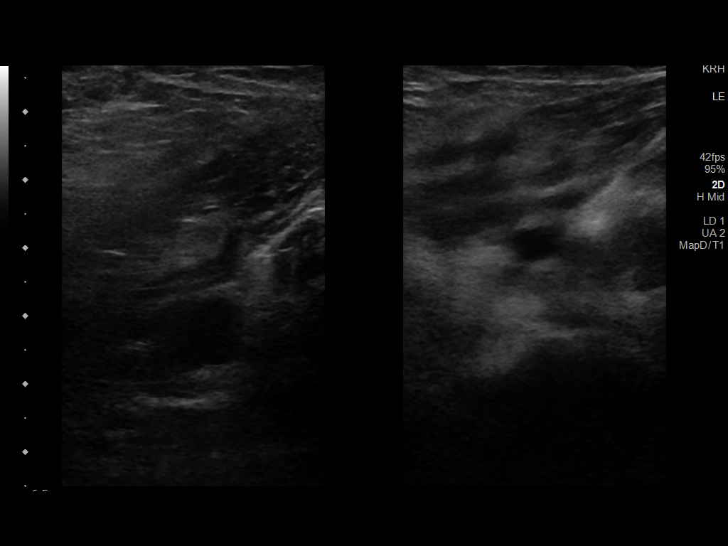
[im 24/33]
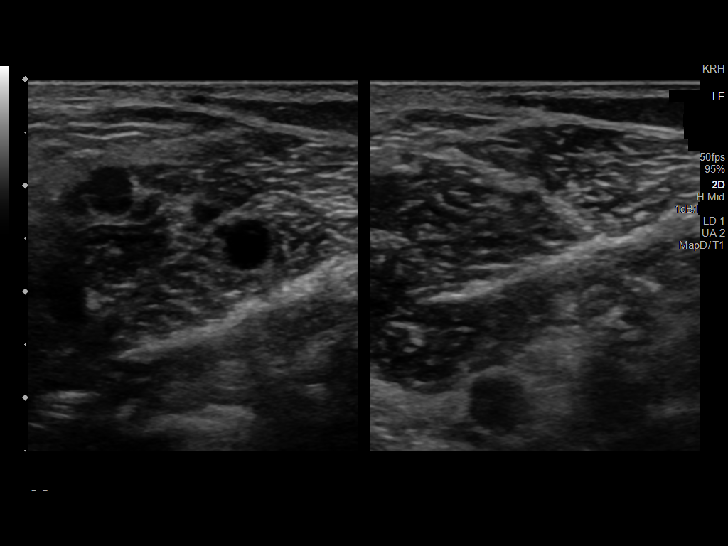
[im 27/33]
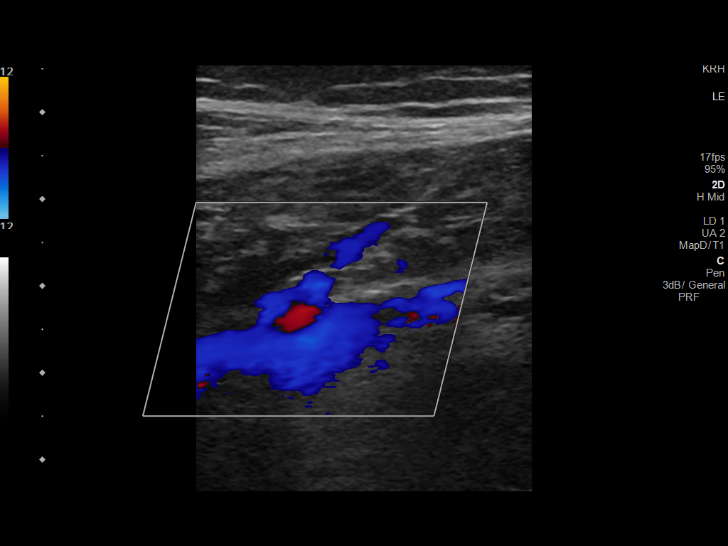
[im 30/33]
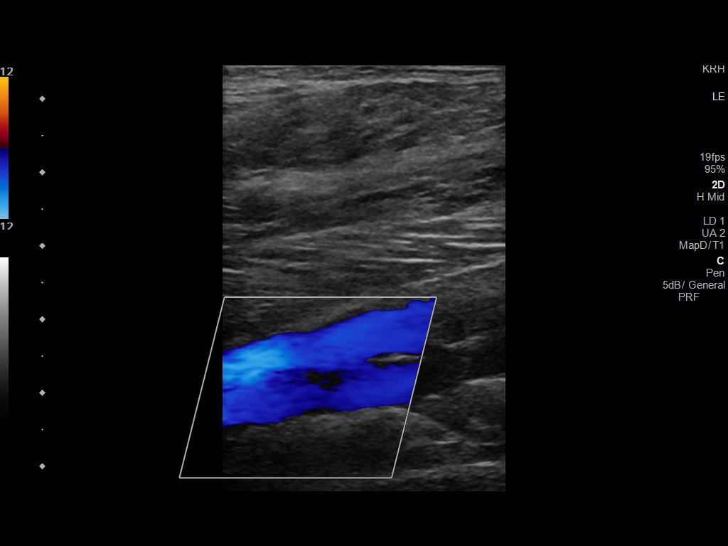
[im 33/33]
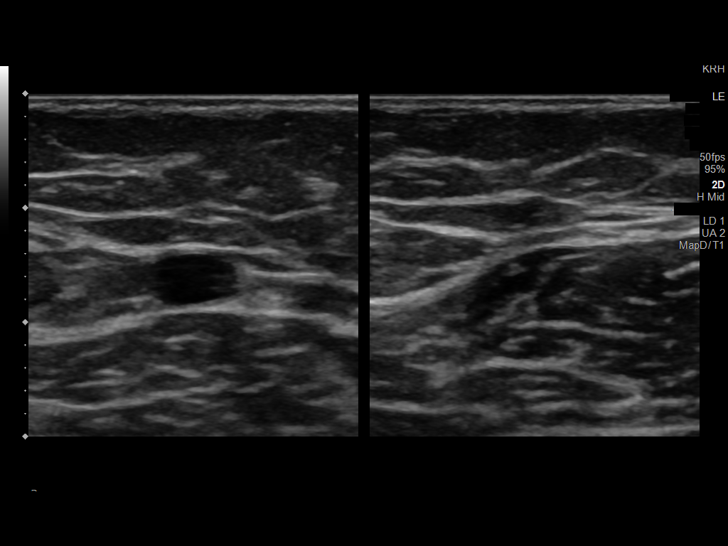

[13 of 24 positions shown; findings below may reference images not displayed]

FINDINGS: Contralateral Common Femoral Vein: Respiratory phasicity is normal
and symmetric with the symptomatic side. No evidence of thrombus.
Normal compressibility.

Common Femoral Vein: No evidence of thrombus. Normal
compressibility, respiratory phasicity and response to augmentation.

Saphenofemoral Junction: No evidence of thrombus. Normal
compressibility and flow on color Doppler imaging.

Profunda Femoral Vein: No evidence of thrombus. Normal
compressibility and flow on color Doppler imaging.

Femoral Vein: No evidence of thrombus. Normal compressibility,
respiratory phasicity and response to augmentation.

Popliteal Vein: No evidence of thrombus. Normal compressibility,
respiratory phasicity and response to augmentation.

Calf Veins: No evidence of thrombus. Normal compressibility and flow
on color Doppler imaging.

Superficial Great Saphenous Vein: No evidence of thrombus. Normal
compressibility.

Venous Reflux:  None.

Other Findings:  None.
IMPRESSION: No evidence of deep venous thrombosis.

## 2022-12-08 ENCOUNTER — Other Ambulatory Visit: Payer: Self-pay

## 2022-12-08 ENCOUNTER — Emergency Department (HOSPITAL_BASED_OUTPATIENT_CLINIC_OR_DEPARTMENT_OTHER)
Admission: EM | Admit: 2022-12-08 | Discharge: 2022-12-08 | Disposition: A | Discharge status: Home / Self Care | Payer: Commercial Managed Care - HMO | Attending: Emergency Medicine | Admitting: Emergency Medicine

## 2022-12-08 ENCOUNTER — Encounter (HOSPITAL_BASED_OUTPATIENT_CLINIC_OR_DEPARTMENT_OTHER): Payer: Self-pay | Admitting: Emergency Medicine

## 2022-12-08 DIAGNOSIS — R202 Paresthesia of skin: Secondary | ICD-10-CM

## 2022-12-08 LAB — CBC WITH DIFFERENTIAL/PLATELET
Abs Immature Granulocytes: 0.01 10*3/uL (ref 0.00–0.07)
Basophils Absolute: 0.1 10*3/uL (ref 0.0–0.1)
Basophils Relative: 2 %
Eosinophils Absolute: 0.1 10*3/uL (ref 0.0–0.5)
Eosinophils Relative: 2 %
HCT: 52.3 % — ABNORMAL HIGH (ref 39.0–52.0)
Hemoglobin: 17.5 g/dL — ABNORMAL HIGH (ref 13.0–17.0)
Immature Granulocytes: 0 %
Lymphocytes Relative: 52 %
Lymphs Abs: 2.2 10*3/uL (ref 0.7–4.0)
MCH: 31.9 pg (ref 26.0–34.0)
MCHC: 33.5 g/dL (ref 30.0–36.0)
MCV: 95.4 fL (ref 80.0–100.0)
Monocytes Absolute: 0.4 10*3/uL (ref 0.1–1.0)
Monocytes Relative: 11 %
Neutro Abs: 1.4 10*3/uL — ABNORMAL LOW (ref 1.7–7.7)
Neutrophils Relative %: 33 %
Platelets: 266 10*3/uL (ref 150–400)
RBC: 5.48 MIL/uL (ref 4.22–5.81)
RDW: 12.7 % (ref 11.5–15.5)
WBC: 4.2 10*3/uL (ref 4.0–10.5)
nRBC: 0 % (ref 0.0–0.2)

## 2022-12-08 LAB — BASIC METABOLIC PANEL
Anion gap: 8 (ref 5–15)
BUN: 11 mg/dL (ref 6–20)
CO2: 25 mmol/L (ref 22–32)
Calcium: 10.5 mg/dL — ABNORMAL HIGH (ref 8.9–10.3)
Chloride: 102 mmol/L (ref 98–111)
Creatinine, Ser: 1.35 mg/dL — ABNORMAL HIGH (ref 0.61–1.24)
GFR, Estimated: >60 mL/min (ref >60)
Glucose, Bld: 99 mg/dL (ref 70–99)
Potassium: 4.5 mmol/L (ref 3.5–5.1)
Sodium: 135 mmol/L (ref 135–145)

## 2022-12-08 LAB — MAGNESIUM: Magnesium: 2 mg/dL (ref 1.7–2.4)

## 2022-12-08 LAB — CK: Total CK: 243 U/L (ref 49–397)

## 2022-12-08 NOTE — ED Triage Notes (Signed)
Pt arrives pov, steady gait, reports RT hand numbness x 1 month to thumb and ring and index fingers. Reports Arm pain and numbness xx 2 weeks that has worsened over last 4 days. Denies injury. Speech clear, Full ROM noted

## 2022-12-08 NOTE — ED Provider Notes (Signed)
[facility] Provider Note   CSN: [id number] Arrival date & time: 12/08/22  8088     History  Chief Complaint  Patient presents with   Extremity Weakness    Kyle Warren is a 57 y.o. male with a past medical history of hypertension, PE in 2016 not currently on blood thinner presenting to the emergency department for evaluation of paresthesia on his right arm.  Patient reports that he started to have tingling and numbness on his right thumb, index and middle finger.  Patient reports about 2 weeks ago the tingling and numbness started to spread to his right wrist and right arm.  He denies any swelling, pain or fever.  No recent injury, or surgical procedure on his right arm.  No chest pain or shortness of breath.   Extremity Weakness    Past Medical History:  Diagnosis Date   High cholesterol    Hypertension    Past Surgical History:  Procedure Laterality Date   FRACTURE SURGERY     bilat femor   TONSILLECTOMY       Home Medications Prior to Admission medications   Medication Sig Start Date End Date Taking? Authorizing Provider  fexofenadine (ALLEGRA) 60 MG tablet Take 60 mg by mouth 2 (two) times daily.    [provider]  HYDROcodone-acetaminophen (NORCO) 5-325 MG tablet Take 1 tablet by mouth every 4 (four) hours as needed for severe pain. 05/01/20   Molpus, Jenny Reichmann, MD      Allergies    Patient has no known allergies.    Review of Systems   Review of Systems  Musculoskeletal:  Positive for extremity weakness.    Physical Exam Updated Vital Signs BP (!) 126/97 (BP Location: Left Arm)   Pulse 72   Temp 98.1 F (36.7 C)   Resp 18   Ht 6\' 2"  (1.88 m)   Wt 117.9 kg   SpO2 100%   BMI 33.38 kg/m  Physical Exam Vitals and nursing note reviewed.  Constitutional:      Appearance: Normal appearance.  HENT:     Head: Normocephalic and atraumatic.     Mouth/Throat:     Mouth: Mucous membranes are moist.  Eyes:      General: No scleral icterus. Cardiovascular:     Rate and Rhythm: Normal rate and regular rhythm.     Pulses: Normal pulses.     Heart sounds: Normal heart sounds.  Pulmonary:     Effort: Pulmonary effort is normal.     Breath sounds: Normal breath sounds.  Abdominal:     General: Abdomen is flat.     Palpations: Abdomen is soft.     Tenderness: There is no abdominal tenderness.  Musculoskeletal:        General: No deformity.  Skin:    General: Skin is warm.     Findings: No rash.  Neurological:     General: No focal deficit present.     Mental Status: He is alert.     Comments: Patient reports tingling and numbness on the right thumb, index, middle finger, wrist and arm.  No swelling or skin redness on his arm.  Psychiatric:        Mood and Affect: Mood normal.     ED Results / Procedures / Treatments   Labs (all labs ordered are listed, but only abnormal results are displayed) Labs Reviewed  CBC WITH DIFFERENTIAL/PLATELET - Abnormal; Notable for the following components:      Result  Value   Hemoglobin 17.5 (*)    HCT 52.3 (*)    Neutro Abs 1.4 (*)    All other components within normal limits  BASIC METABOLIC PANEL - Abnormal; Notable for the following components:   Creatinine, Ser 1.35 (*)    Calcium 10.5 (*)    All other components within normal limits  MAGNESIUM  CK    EKG None  Radiology No results found.  Procedures Procedures    Medications Ordered in ED Medications - No data to display  ED Course/ Medical Decision Making/ A&P                           Medical Decision Making Amount and/or Complexity of Data Reviewed Labs: ordered.   This patient presents to the ED for R arm tingling and numbness, this involves an extensive number of treatment options, and is a complaint that carries with a high risk of complications and morbidity.  The differential diagnosis includes carpal tunnel syndrome, radiculopathy, neuropathy, infectious etiology.  This  is not an exhaustive list.  Comorbidities that complicate the patient evaluation See HPI  Social determinants of health NA  Additional history obtained: External records from outside source obtained and reviewed including: Chart review including previous notes, labs, imaging.  Cardiac monitoring/EKG: The patient was maintained on a cardiac monitor.  I personally reviewed and interpreted the cardiac monitor which showed an underlying rhythm of: Sinus rhythm.  Lab tests: I ordered and personally interpreted labs.  The pertinent results include: WBC unremarkable. Hbg unremarkable. Platelets unremarkable. No electrolyte abnormalities noted. BUN, creatinine unremarkable. CK, magnesium normal.  Imaging studies:    Problem list/ ED course/ Critical interventions/ Medical management: HPI: See above Vital signs within normal range and stable throughout visit. Laboratory/imaging studies significant for: See above. On physical examination, patient is afebrile and appears in no acute distress. Based on patient's clinical presentations and laboratory/imaging studies I suspect carpal tunnel syndrome versus neuropathy.  Considered but low suspicion for stroke as patient has no focal neurological deficits.  I advised patient to wear a wrist brace for carpal tunnel syndrome.  Advised patient to follow-up with orthopedics for further evaluation and management.  Return to the ER if new or worsening symptoms.  I have reviewed the patient home medicines and have made adjustments as needed.  Consultations obtained: I requested consultation with Dr. Pearline Cables, and discussed lab and imaging findings as well as pertinent plan.  He agrees with the plan.  Disposition Continued outpatient therapy. Follow-up with orthopedics recommended for reevaluation of symptoms. Treatment plan discussed with patient.  Pt acknowledged understanding was agreeable to the plan. Worrisome signs and symptoms were discussed with  patient, and patient acknowledged understanding to return to the ED if they noticed these signs and symptoms. Patient was stable upon discharge.   This chart was dictated using voice recognition software.  Despite best efforts to proofread,  errors can occur which can change the documentation meaning.          Final Clinical Impression(s) / ED Diagnoses Final diagnoses:  Arm paresthesia, right  Right hand paresthesia    Rx / DC Orders ED Discharge Orders     None         Rex Kras, [place] 12/08/22 1837    Jeanell Sparrow, DO 12/11/22 2039

## 2022-12-08 NOTE — Discharge Instructions (Addendum)
Please take tylenol/ibuprofen for pain. I recommend close follow-up with orthopedics for reevaluation.  Please do not hesitate to return to emergency department if worrisome signs symptoms we discussed become apparent.
